# Patient Record
Sex: Female | Born: 1980 | Race: Black or African American | Hispanic: No | Marital: Single | Smoking: Never smoker
Health system: Southern US, Community
[De-identification: ages and names within clinical notes are randomized; demographics above are authoritative.]

## PROBLEM LIST (undated history)

## (undated) DIAGNOSIS — N159 Renal tubulo-interstitial disease, unspecified: Secondary | ICD-10-CM

## (undated) DIAGNOSIS — N390 Urinary tract infection, site not specified: Secondary | ICD-10-CM

## (undated) DIAGNOSIS — J45909 Unspecified asthma, uncomplicated: Secondary | ICD-10-CM

## (undated) DIAGNOSIS — D649 Anemia, unspecified: Secondary | ICD-10-CM

## (undated) HISTORY — DX: Unspecified asthma, uncomplicated: J45.909

---

## 2010-07-29 ENCOUNTER — Emergency Department (HOSPITAL_BASED_OUTPATIENT_CLINIC_OR_DEPARTMENT_OTHER)
Admission: EM | Admit: 2010-07-29 | Discharge: 2010-07-29 | Disposition: A | Discharge status: Home / Self Care | Payer: Self-pay | Attending: Emergency Medicine | Admitting: Emergency Medicine

## 2010-07-29 DIAGNOSIS — E860 Dehydration: Secondary | ICD-10-CM | POA: Insufficient documentation

## 2010-07-29 DIAGNOSIS — K117 Disturbances of salivary secretion: Secondary | ICD-10-CM | POA: Insufficient documentation

## 2010-07-29 LAB — URINALYSIS, ROUTINE W REFLEX MICROSCOPIC
Bilirubin Urine: NEGATIVE
Hgb urine dipstick: NEGATIVE
Ketones, ur: NEGATIVE mg/dL

## 2010-07-29 LAB — PREGNANCY, URINE: Preg Test, Ur: NEGATIVE

## 2010-07-29 LAB — BASIC METABOLIC PANEL
BUN: 15 mg/dL (ref 6–23)
Chloride: 106 mEq/L (ref 96–112)
Potassium: 3.8 mEq/L (ref 3.5–5.1)
Sodium: 144 mEq/L (ref 135–145)

## 2010-07-29 LAB — GLUCOSE, CAPILLARY: Glucose-Capillary: 93 mg/dL (ref 70–99)

## 2011-03-21 ENCOUNTER — Emergency Department (HOSPITAL_BASED_OUTPATIENT_CLINIC_OR_DEPARTMENT_OTHER)
Admission: EM | Admit: 2011-03-21 | Discharge: 2011-03-21 | Disposition: A | Discharge status: Home / Self Care | Payer: Self-pay | Attending: Emergency Medicine | Admitting: Emergency Medicine

## 2011-03-21 DIAGNOSIS — W19XXXA Unspecified fall, initial encounter: Secondary | ICD-10-CM | POA: Insufficient documentation

## 2011-03-21 DIAGNOSIS — S335XXA Sprain of ligaments of lumbar spine, initial encounter: Secondary | ICD-10-CM | POA: Insufficient documentation

## 2011-03-21 DIAGNOSIS — S39012A Strain of muscle, fascia and tendon of lower back, initial encounter: Secondary | ICD-10-CM

## 2011-03-21 DIAGNOSIS — Y9229 Other specified public building as the place of occurrence of the external cause: Secondary | ICD-10-CM | POA: Insufficient documentation

## 2011-03-21 HISTORY — DX: Renal tubulo-interstitial disease, unspecified: N15.9

## 2011-03-21 MED ORDER — IBUPROFEN 600 MG PO TABS: 600.0000 mg | ORAL_TABLET | Freq: Four times a day (QID) | ORAL | Status: AC | PRN

## 2011-03-21 NOTE — ED Provider Notes (Signed)
History     CSN: [id number] Arrival date & time: 03/21/2011  7:32 PM   First MD Initiated Contact with Patient 03/21/11 2031      Chief Complaint  Patient presents with  . Flank Pain  . Fall   [place] She did not initially seek treatment for this but states she continues to have diffuse low back pain. She states the pain is worse with movement or palpation. Apparently had taken some pain medications given to her by a friend. She states the pain is moderate, but not excruciating. She denies any numbness, weakness or tingling. Denies any bowel or bladder incontinence. She denies any chance of being pregnant. She has had no urinary symptoms  (Consider location/radiation/quality/duration/timing/severity/associated sxs/prior treatment) HPI  Past Medical History  Diagnosis Date  . Kidney infection     History reviewed. No pertinent past surgical history.  History reviewed. No pertinent family history.  History  Substance Use Topics  . Smoking status: Never Smoker   . Smokeless tobacco: Never Used  . Alcohol Use: Yes     socially    OB History    Grav Para Term Preterm Abortions TAB SAB Ect Mult Living                  Review of Systems  All other systems reviewed and are negative.    Allergies  Review of patient's allergies indicates no known allergies.  Home Medications   Current Outpatient Rx  Name Route Sig Dispense Refill  . IBUPROFEN 800 MG PO TABS Oral Take 800 mg by mouth every 8 (eight) hours as needed. For pain     . PARAGARD INTRAUTERINE COPPER IU IUD Intrauterine 1 each by Intrauterine route once.        BP 123/73  Pulse 73  Temp(Src) 99 F (37.2 C) (Oral)  Resp 17  Ht 5\' 4"  (1.626 m)  Wt 128 lb (58.06 kg)  BMI 21.97 kg/m2  SpO2 100%  LMP 02/24/2011  Physical Exam  Constitutional: She is oriented to person, place, and time. She appears well-developed and well-nourished.  HENT:  Head: Normocephalic and  atraumatic.  Eyes: Conjunctivae and EOM are normal. Pupils are equal, round, and reactive to light.  Neck: Neck supple.  Cardiovascular: Normal rate and regular rhythm.  Exam reveals no gallop and no friction rub.   No murmur heard. Pulmonary/Chest: Breath sounds normal. She has no wheezes. She has no rales. She exhibits no tenderness.  Abdominal: Soft. Bowel sounds are normal. She exhibits no distension. There is no tenderness. There is no rebound and no guarding.  Musculoskeletal: Normal range of motion.       Diffuse low back tenderness, no soft tissue swelling, no direct spinal tenderness, pelvis is stable. No deformity, no ecchymoses.  Neurological: She is alert and oriented to person, place, and time. She displays normal reflexes. No cranial nerve deficit. Coordination normal.  Skin: Skin is warm and dry. No rash noted.  Psychiatric: She has a normal mood and affect.    ED Course  Procedures (including critical care time)  Labs Reviewed - No data to display No results found.   No diagnosis found.    MDM  Pt is seen and examined;  Initial history and physical completed.  Will follow.          Wadie Mattie A. Patrica Duel, MD 03/21/11 2038

## 2011-03-21 NOTE — ED Notes (Signed)
Pt states that two days ago she fell while shopping in [employer], has not sought prior treatment for this pain.  Pt states that she has pain in both sides of her back/flank area, pt states that she is unable to turn to the left or right every well due to the pain.  Pt states that she has taken some RX pain meds given to her by a friend

## 2012-04-16 ENCOUNTER — Emergency Department (HOSPITAL_BASED_OUTPATIENT_CLINIC_OR_DEPARTMENT_OTHER): Payer: Self-pay

## 2012-04-16 ENCOUNTER — Emergency Department (HOSPITAL_BASED_OUTPATIENT_CLINIC_OR_DEPARTMENT_OTHER)
Admission: EM | Admit: 2012-04-16 | Discharge: 2012-04-16 | Disposition: A | Discharge status: Home / Self Care | Payer: Self-pay | Attending: Emergency Medicine | Admitting: Emergency Medicine

## 2012-04-16 ENCOUNTER — Encounter (HOSPITAL_BASED_OUTPATIENT_CLINIC_OR_DEPARTMENT_OTHER): Payer: Self-pay | Admitting: *Deleted

## 2012-04-16 DIAGNOSIS — S90129A Contusion of unspecified lesser toe(s) without damage to nail, initial encounter: Secondary | ICD-10-CM | POA: Insufficient documentation

## 2012-04-16 DIAGNOSIS — Y939 Activity, unspecified: Secondary | ICD-10-CM | POA: Insufficient documentation

## 2012-04-16 DIAGNOSIS — Z87448 Personal history of other diseases of urinary system: Secondary | ICD-10-CM | POA: Insufficient documentation

## 2012-04-16 DIAGNOSIS — Y929 Unspecified place or not applicable: Secondary | ICD-10-CM | POA: Insufficient documentation

## 2012-04-16 DIAGNOSIS — X58XXXA Exposure to other specified factors, initial encounter: Secondary | ICD-10-CM | POA: Insufficient documentation

## 2012-04-16 DIAGNOSIS — Z975 Presence of (intrauterine) contraceptive device: Secondary | ICD-10-CM | POA: Insufficient documentation

## 2012-04-16 DIAGNOSIS — S90229A Contusion of unspecified lesser toe(s) with damage to nail, initial encounter: Secondary | ICD-10-CM

## 2012-04-16 NOTE — ED Notes (Signed)
Patient states she has a 2 week history of pain in her left great toe.  States she took her toenail polish off and her toenail is green in color.

## 2012-04-16 NOTE — ED Provider Notes (Signed)
History     CSN: [id number]  Arrival date & time 04/16/12  1151   First MD Initiated Contact with Patient 04/16/12 1332      Chief Complaint  Patient presents with  . Toe Pain    (Consider location/radiation/quality/duration/timing/severity/associated sxs/prior treatment) Patient is a 31 y.o. female presenting with toe pain. The history is provided by the patient. No language interpreter was used.  Toe Pain This is a chronic problem. The current episode started 1 to 4 weeks ago. The problem occurs daily. Pertinent negatives include no fever, nausea, vomiting or weakness. The symptoms are aggravated by walking. She has tried nothing for the symptoms.   Patient here with c/o L great toe pain after wearing new shoes 2 weeks ago.  Patient states that the toenail has turned black since.  Pain is 2/10 today.  Taking motrin for pain with relief.  No pmh.  Past Medical History  Diagnosis Date  . Kidney infection     History reviewed. No pertinent past surgical history.  No family history on file.  History  Substance Use Topics  . Smoking status: Never Smoker   . Smokeless tobacco: Never Used  . Alcohol Use: Yes     Comment: socially    OB History    Grav Para Term Preterm Abortions TAB SAB Ect Mult Living                  Review of Systems  Constitutional: Negative.  Negative for fever.  HENT: Negative.   Eyes: Negative.   Respiratory: Negative.   Cardiovascular: Negative.   Gastrointestinal: Negative.  Negative for nausea and vomiting.  Musculoskeletal: Negative for gait problem.       L great toe pain bruising  Neurological: Negative.  Negative for weakness.  Psychiatric/Behavioral: Negative.   All other systems reviewed and are negative.    Allergies  Review of patient's allergies indicates no known allergies.  Home Medications   Current Outpatient Rx  Name  Route  Sig  Dispense  Refill  . IBUPROFEN 800 MG PO TABS   Oral   Take 800 mg by mouth every 8  (eight) hours as needed. For pain          . PARAGARD INTRAUTERINE COPPER IU IUD   Intrauterine   1 each by Intrauterine route once.             BP 115/82  Pulse 84  Temp 98.2 F (36.8 C) (Oral)  Resp 20  Ht 5\' 4"  (1.626 m)  Wt 126 lb (57.153 kg)  BMI 21.63 kg/m2  SpO2 100%  LMP 04/15/2012  Physical Exam  Nursing note and vitals reviewed. Constitutional: She is oriented to person, place, and time. She appears well-developed and well-nourished.  HENT:  Head: Normocephalic and atraumatic.  Eyes: Conjunctivae normal and EOM are normal. Pupils are equal, round, and reactive to light.  Neck: Normal range of motion. Neck supple.  Cardiovascular: Normal rate.   Pulmonary/Chest: Effort normal.  Abdominal: Soft.  Musculoskeletal: Normal range of motion. She exhibits tenderness. She exhibits no edema.       L great toenail blackened and bruised tender to palpatation  Neurological: She is alert and oriented to person, place, and time. She has normal reflexes.  Skin: Skin is warm and dry.  Psychiatric: She has a normal mood and affect.    ED Course  Procedures (including critical care time)  Labs Reviewed - No data to display Dg Toe Great Left  04/16/2012  *RADIOLOGY REPORT*  Clinical Data: Left great toe pain.  LEFT GREAT TOE  Comparison: None.  Findings: No acute bony abnormality.  Specifically, no fracture, subluxation, or dislocation.  Soft tissues are intact. Joint spaces are maintained.  Normal bone mineralization.  IMPRESSION: No bony abnormality.   Original Report Authenticated By: Charlett Nose, M.D.      No diagnosis found.    MDM  L great toe bruising.  X-ray shows no fracture reviewed by myself. Patient understands that she may loose her toenail.  Pain 2/10       Remi Haggard, NP 04/17/12 [phone]

## 2012-04-22 NOTE — ED Provider Notes (Signed)
Medical screening examination/treatment/procedure(s) were performed by non-physician practitioner and as supervising physician I was immediately available for consultation/collaboration.   Richardean Canal, MD 04/22/12 [phone]

## 2012-08-06 ENCOUNTER — Encounter (HOSPITAL_BASED_OUTPATIENT_CLINIC_OR_DEPARTMENT_OTHER): Payer: Self-pay

## 2012-08-06 ENCOUNTER — Emergency Department (HOSPITAL_BASED_OUTPATIENT_CLINIC_OR_DEPARTMENT_OTHER)
Admission: EM | Admit: 2012-08-06 | Discharge: 2012-08-06 | Disposition: A | Discharge status: Home / Self Care | Payer: No Typology Code available for payment source | Attending: Emergency Medicine | Admitting: Emergency Medicine

## 2012-08-06 DIAGNOSIS — Z3202 Encounter for pregnancy test, result negative: Secondary | ICD-10-CM | POA: Insufficient documentation

## 2012-08-06 DIAGNOSIS — N898 Other specified noninflammatory disorders of vagina: Secondary | ICD-10-CM | POA: Insufficient documentation

## 2012-08-06 DIAGNOSIS — R109 Unspecified abdominal pain: Secondary | ICD-10-CM

## 2012-08-06 DIAGNOSIS — R1084 Generalized abdominal pain: Secondary | ICD-10-CM | POA: Insufficient documentation

## 2012-08-06 DIAGNOSIS — Z87440 Personal history of urinary (tract) infections: Secondary | ICD-10-CM | POA: Insufficient documentation

## 2012-08-06 DIAGNOSIS — N939 Abnormal uterine and vaginal bleeding, unspecified: Secondary | ICD-10-CM

## 2012-08-06 DIAGNOSIS — R197 Diarrhea, unspecified: Secondary | ICD-10-CM | POA: Insufficient documentation

## 2012-08-06 HISTORY — DX: Urinary tract infection, site not specified: N39.0

## 2012-08-06 LAB — URINE MICROSCOPIC-ADD ON

## 2012-08-06 LAB — CBC WITH DIFFERENTIAL/PLATELET
Basophils Relative: 0 % (ref 0–1)
Eosinophils Relative: 1 % (ref 0–5)
Hemoglobin: 13.1 g/dL (ref 12.0–15.0)
Lymphocytes Relative: 39 % (ref 12–46)
Monocytes Relative: 9 % (ref 3–12)
Neutrophils Relative %: 51 % (ref 43–77)
Platelets: 241 10*3/uL (ref 150–400)
RBC: 4.38 MIL/uL (ref 3.87–5.11)
WBC: 5 10*3/uL (ref 4.0–10.5)

## 2012-08-06 LAB — COMPREHENSIVE METABOLIC PANEL
ALT: 34 U/L (ref 0–35)
AST: 20 U/L (ref 0–37)
Alkaline Phosphatase: 46 U/L (ref 39–117)
CO2: 26 mEq/L (ref 19–32)
GFR calc Af Amer: >90 mL/min (ref >90)
GFR calc non Af Amer: >90 mL/min (ref >90)
Glucose, Bld: 90 mg/dL (ref 70–99)
Potassium: 3.5 mEq/L (ref 3.5–5.1)
Sodium: 138 mEq/L (ref 135–145)

## 2012-08-06 LAB — URINALYSIS, ROUTINE W REFLEX MICROSCOPIC
Glucose, UA: NEGATIVE mg/dL
Ketones, ur: NEGATIVE mg/dL
Nitrite: NEGATIVE
Specific Gravity, Urine: 1.03 (ref 1.005–1.030)
pH: 6 (ref 5.0–8.0)

## 2012-08-06 LAB — PREGNANCY, URINE: Preg Test, Ur: NEGATIVE

## 2012-08-06 MED ORDER — LOPERAMIDE HCL 2 MG PO CAPS: ORAL_CAPSULE | ORAL | Status: DC

## 2012-08-06 NOTE — ED Notes (Signed)
Pt with mid lower abdominal pain, cramping, reports that she feels like she may have been running a fever.  Vomited once a week ago.  Denies any chronic medical problems.

## 2012-08-06 NOTE — ED Provider Notes (Signed)
History     CSN: [id number]  Arrival date & time 08/06/12  0902   First MD Initiated Contact with Patient 08/06/12 [phone]      Chief Complaint  Patient presents with  . Abdominal Pain    (Consider location/radiation/quality/duration/timing/severity/associated sxs/prior treatment) HPI This 32 year old female has a one-week history of 6-12 episodes per day of nonbloody diarrhea but might possibly have had a little bit of blood in it last night she's not sure, she might have vaginal bleeding instead could not tell the difference and she was wiping, it was a small amount of blood, she is no dysuria or vaginal discharge, she does have intermittent diffuse abdominal pain lasting several hours at a time for the last week, she has multiple friends and relatives with similar symptoms for the last several days as well, she had one episode of nausea and vomiting a week ago when this started, she has not had vomiting or nausea since then, she is able to take oral fluids well, she is no fever rash body aches cough chest pain or shortness of breath, since her symptoms did not resolve and weak she came to the ED for evaluation, her abdominal pain is not severe or localized, there is no treatment prior to arrival. Past Medical History  Diagnosis Date  . Kidney infection   . UTI (lower urinary tract infection)     History reviewed. No pertinent past surgical history.  History reviewed. No pertinent family history.  History  Substance Use Topics  . Smoking status: Never Smoker   . Smokeless tobacco: Never Used  . Alcohol Use: Yes     Comment: socially    OB History   Grav Para Term Preterm Abortions TAB SAB Ect Mult Living                  Review of Systems 10 Systems reviewed and are negative for acute change except as noted in the HPI. Allergies  Review of patient's allergies indicates no known allergies.  Home Medications   Current Outpatient Rx  Name  Route  Sig  Dispense  Refill  .  ibuprofen (ADVIL,MOTRIN) 800 MG tablet   Oral   Take 800 mg by mouth every 8 (eight) hours as needed. For pain          . IUD's (PARAGARD INTRAUTERINE COPPER) IUD IUD   Intrauterine   1 each by Intrauterine route once.           . loperamide (IMODIUM) 2 MG capsule      Take two tabs po initially, then one tab after each loose stool: max 8 tabs in 24 hours   12 capsule   0     BP 103/56  Pulse 60  Temp(Src) 98.9 F (37.2 C) (Oral)  SpO2 100%  LMP 07/15/2012  Physical Exam  Nursing note and vitals reviewed. Constitutional:  Awake, alert, nontoxic appearance.  HENT:  Head: Atraumatic.  Eyes: Right eye exhibits no discharge. Left eye exhibits no discharge.  Neck: Neck supple.  Cardiovascular: Normal rate and regular rhythm.   No murmur heard. Pulmonary/Chest: Effort normal and breath sounds normal. No respiratory distress. She has no wheezes. She has no rales. She exhibits no tenderness.  Abdominal: Soft. Bowel sounds are normal. She exhibits no distension and no mass. There is tenderness. There is no rebound and no guarding.  Mild diffuse nonlocalized tenderness without rebound  Musculoskeletal: She exhibits no edema and no tenderness.  Baseline ROM, no obvious new  focal weakness.  Neurological: She is alert.  Mental status and motor strength appears baseline for patient and situation. Normal speech and gait.  Skin: No rash noted.  Psychiatric: She has a normal mood and affect.   chaperone present for rectal examination reveals light brown stool with no blood; bimanual examination reveals nontender cervix with cervix closed with no cervical motion tenderness no adnexal tenderness no palpable adnexal masses no discharge noted in the examination glove but the patient does have small amount of vaginal bleeding on examination glove  ED Course  Procedures (including critical care time) Suspect contaminated urine specimen. Labs Reviewed  URINALYSIS, ROUTINE W REFLEX  MICROSCOPIC - Abnormal; Notable for the following:    APPearance TURBID (*)    Hgb urine dipstick LARGE (*)    Protein, ur 30 (*)    Leukocytes, UA MODERATE (*)    All other components within normal limits  URINE MICROSCOPIC-ADD ON - Abnormal; Notable for the following:    Squamous Epithelial / LPF MANY (*)    Bacteria, UA MANY (*)    All other components within normal limits  URINE CULTURE  PREGNANCY, URINE  CBC WITH DIFFERENTIAL  COMPREHENSIVE METABOLIC PANEL  LIPASE, BLOOD   No results found.   1. Diarrhea   2. Abdominal pain   3. Abnormal vaginal bleeding       MDM  I doubt any other EMC precluding discharge at this time including, but not necessarily limited to the following:sepsis, PID.        Hurman Horn, MD 08/06/12 [phone]

## 2012-08-07 LAB — URINE CULTURE

## 2012-12-29 ENCOUNTER — Ambulatory Visit (INDEPENDENT_AMBULATORY_CARE_PROVIDER_SITE_OTHER): Payer: No Typology Code available for payment source | Admitting: Family Medicine

## 2012-12-29 ENCOUNTER — Encounter: Payer: Self-pay | Admitting: Family Medicine

## 2012-12-29 ENCOUNTER — Other Ambulatory Visit (HOSPITAL_COMMUNITY)
Admission: RE | Admit: 2012-12-29 | Discharge: 2012-12-29 | Disposition: A | Discharge status: Home / Self Care | Payer: No Typology Code available for payment source | Source: Ambulatory Visit | Attending: Family Medicine | Admitting: Family Medicine

## 2012-12-29 VITALS — BP 114/62 | HR 64 | Temp 98.2°F | Ht 64.5 in | Wt 154.6 lb

## 2012-12-29 DIAGNOSIS — Z01419 Encounter for gynecological examination (general) (routine) without abnormal findings: Secondary | ICD-10-CM | POA: Insufficient documentation

## 2012-12-29 DIAGNOSIS — R609 Edema, unspecified: Secondary | ICD-10-CM

## 2012-12-29 DIAGNOSIS — N390 Urinary tract infection, site not specified: Secondary | ICD-10-CM

## 2012-12-29 DIAGNOSIS — Z124 Encounter for screening for malignant neoplasm of cervix: Secondary | ICD-10-CM

## 2012-12-29 DIAGNOSIS — Z0000 Encounter for general adult medical examination without abnormal findings: Secondary | ICD-10-CM

## 2012-12-29 DIAGNOSIS — Z1151 Encounter for screening for human papillomavirus (HPV): Secondary | ICD-10-CM | POA: Insufficient documentation

## 2012-12-29 LAB — LIPID PANEL
Cholesterol: 120 mg/dL (ref 0–200)
HDL: 45 mg/dL (ref >39.00)
LDL Cholesterol: 65 mg/dL (ref 0–99)
Total CHOL/HDL Ratio: 3
Triglycerides: 48 mg/dL (ref 0.0–149.0)
VLDL: 9.6 mg/dL (ref 0.0–40.0)

## 2012-12-29 LAB — CBC WITH DIFFERENTIAL/PLATELET
Basophils Absolute: 0 10*3/uL (ref 0.0–0.1)
Eosinophils Absolute: 0 10*3/uL (ref 0.0–0.7)
HCT: 38.3 % (ref 36.0–46.0)
Lymphs Abs: 2.3 10*3/uL (ref 0.7–4.0)
MCV: 88.9 fl (ref 78.0–100.0)
Monocytes Absolute: 0.5 10*3/uL (ref 0.1–1.0)
Neutrophils Relative %: 46.3 % (ref 43.0–77.0)
Platelets: 227 10*3/uL (ref 150.0–400.0)
RDW: 13.3 % (ref 11.5–14.6)
WBC: 5.2 10*3/uL (ref 4.5–10.5)

## 2012-12-29 LAB — BASIC METABOLIC PANEL
BUN: 9 mg/dL (ref 6–23)
Chloride: 105 mEq/L (ref 96–112)
Creatinine, Ser: 0.6 mg/dL (ref 0.4–1.2)
Glucose, Bld: 74 mg/dL (ref 70–99)
Potassium: 3.5 mEq/L (ref 3.5–5.1)

## 2012-12-29 LAB — HEPATIC FUNCTION PANEL
ALT: 25 U/L (ref 0–35)
Bilirubin, Direct: 0 mg/dL (ref 0.0–0.3)
Total Bilirubin: 0.5 mg/dL (ref 0.3–1.2)

## 2012-12-29 MED ORDER — HYDROCHLOROTHIAZIDE 12.5 MG PO TABS: 12.5000 mg | ORAL_TABLET | Freq: Every day | ORAL | Status: DC

## 2012-12-29 NOTE — Progress Notes (Signed)
Subjective:     Alicia [name] is a 32 y.o. female and is here for a comprehensive physical exam. The patient reports no problems.  History   Social History  . Marital Status: Single    Spouse Name: N/A    Number of Children: N/A  . Years of Education: N/A   Occupational History  . [employer]     [employer]   Social History Main Topics  . Smoking status: Never Smoker   . Smokeless tobacco: Never Used  . Alcohol Use: Yes     Comment: socially  . Drug Use: No  . Sexual Activity: Yes    Partners: Male    Birth Control/ Protection: IUD   Other Topics Concern  . Not on file   Social History Narrative   Exercise-- no   No health maintenance topics applied.  The following portions of the patient's history were reviewed and updated as appropriate:  She  has a past medical history of Kidney infection; UTI (lower urinary tract infection); and Asthma. She  does not have a problem list on file. She  has no past surgical history on file. Her family history includes Arthritis in her mother; High blood pressure in her maternal grandmother. She  reports that she has never smoked. She has never used smokeless tobacco. She reports that  drinks alcohol. She reports that she does not use illicit drugs. She has a current medication list which includes the following prescription(s): ibuprofen and paragard intrauterine copper. Current Outpatient Prescriptions on File Prior to Visit  Medication Sig Dispense Refill  . ibuprofen (ADVIL,MOTRIN) 800 MG tablet Take 800 mg by mouth every 8 (eight) hours as needed. For pain       . IUD's (PARAGARD INTRAUTERINE COPPER) IUD IUD 1 each by Intrauterine route once.         No current facility-administered medications on file prior to visit.   She has No Known Allergies..  Review of Systems Review of Systems  Constitutional: Negative for activity change, appetite change and fatigue.  HENT: Negative for hearing loss, congestion, tinnitus and ear  discharge.  dentist -no Eyes: Negative for visual disturbance (see optho-no) Respiratory: Negative for cough, chest tightness and shortness of breath.   Cardiovascular: Negative for chest pain, palpitations and leg swelling.  Gastrointestinal: Negative for abdominal pain, diarrhea, constipation and abdominal distention.  Genitourinary: Negative for urgency, frequency, decreased urine volume and difficulty urinating.  Musculoskeletal: Negative for back pain, arthralgias and gait problem.  Skin: Negative for color change, pallor and rash.  Neurological: Negative for dizziness, light-headedness, numbness and headaches.  Hematological: Negative for adenopathy. Does not bruise/bleed easily.  Psychiatric/Behavioral: Negative for suicidal ideas, confusion, sleep disturbance, self-injury, dysphoric mood, decreased concentration and agitation.       Objective:    BP 114/62  Pulse 64  Temp(Src) 98.2 F (36.8 C) (Oral)  Ht 5' 4.5" (1.638 m)  Wt 154 lb 9.6 oz (70.126 kg)  BMI 26.14 kg/m2  SpO2 97%  LMP 12/22/2012 General appearance: alert, cooperative, appears stated age and no distress Head: Normocephalic, without obvious abnormality, atraumatic Eyes: conjunctivae/corneas clear. PERRL, EOM's intact. Fundi benign. Ears: normal TM's and external ear canals both ears Nose: Nares normal. Septum midline. Mucosa normal. No drainage or sinus tenderness. Throat: lips, mucosa, and tongue normal; teeth and gums normal Neck: no adenopathy, no carotid bruit, no JVD, supple, symmetrical, trachea midline and thyroid not enlarged, symmetric, no tenderness/mass/nodules Back: symmetric, no curvature. ROM normal. No CVA tenderness. Lungs: clear  to auscultation bilaterally Breasts: normal appearance, no masses or tenderness Heart: regular rate and rhythm, S1, S2 normal, no murmur, click, rub or gallop Abdomen: soft, non-tender; bowel sounds normal; no masses,  no organomegaly Pelvic: cervix normal in  appearance, external genitalia normal, no adnexal masses or tenderness, no cervical motion tenderness, rectovaginal septum normal, uterus normal size, shape, and consistency, vagina normal without discharge and pap done Extremities: extremities normal, atraumatic, no cyanosis or edema Pulses: 2+ and symmetric Skin: Skin color, texture, turgor normal. No rashes or lesions Lymph nodes: Cervical, supraclavicular, and axillary nodes normal. Neurologic: Alert and oriented X 3, normal strength and tone. Normal symmetric reflexes. Normal coordination and gait Psych- no anxiety, no depression      Assessment:    Healthy female exam.      Plan:    ghm utd Check labs See After Visit Summary for Counseling Recommendations

## 2012-12-29 NOTE — Patient Instructions (Signed)
Preventive Care for Adults, Female A healthy lifestyle and preventive care can promote health and wellness. Preventive health guidelines for women include the following key practices.  A routine yearly physical is a good way to check with your caregiver about your health and preventive screening. It is a chance to share any concerns and updates on your health, and to receive a thorough exam.  Visit your dentist for a routine exam and preventive care every 6 months. Brush your teeth twice a day and floss once a day. Good oral hygiene prevents tooth decay and gum disease.  The frequency of eye exams is based on your age, health, family medical history, use of contact lenses, and other factors. Follow your caregiver's recommendations for frequency of eye exams.  Eat a healthy diet. Foods like vegetables, fruits, whole grains, low-fat dairy products, and lean protein foods contain the nutrients you need without too many calories. Decrease your intake of foods high in solid fats, added sugars, and salt. Eat the right amount of calories for you.Get information about a proper diet from your caregiver, if necessary.  Regular physical exercise is one of the most important things you can do for your health. Most adults should get at least 150 minutes of moderate-intensity exercise (any activity that increases your heart rate and causes you to sweat) each week. In addition, most adults need muscle-strengthening exercises on 2 or more days a week.  Maintain a healthy weight. The body mass index (BMI) is a screening tool to identify possible weight problems. It provides an estimate of body fat based on height and weight. Your caregiver can help determine your BMI, and can help you achieve or maintain a healthy weight.For adults 20 years and older:  A BMI below 18.5 is considered underweight.  A BMI of 18.5 to 24.9 is normal.  A BMI of 25 to 29.9 is considered overweight.  A BMI of 30 and above is  considered obese.  Maintain normal blood lipids and cholesterol levels by exercising and minimizing your intake of saturated fat. Eat a balanced diet with plenty of fruit and vegetables. Blood tests for lipids and cholesterol should begin at age 20 and be repeated every 5 years. If your lipid or cholesterol levels are high, you are over 50, or you are at high risk for heart disease, you may need your cholesterol levels checked more frequently.Ongoing high lipid and cholesterol levels should be treated with medicines if diet and exercise are not effective.  If you smoke, find out from your caregiver how to quit. If you do not use tobacco, do not start.  If you are pregnant, do not drink alcohol. If you are breastfeeding, be very cautious about drinking alcohol. If you are not pregnant and choose to drink alcohol, do not exceed 1 drink per day. One drink is considered to be 12 ounces (355 mL) of beer, 5 ounces (148 mL) of wine, or 1.5 ounces (44 mL) of liquor.  Avoid use of street drugs. Do not share needles with anyone. Ask for help if you need support or instructions about stopping the use of drugs.  High blood pressure causes heart disease and increases the risk of stroke. Your blood pressure should be checked at least every 1 to 2 years. Ongoing high blood pressure should be treated with medicines if weight loss and exercise are not effective.  If you are 55 to 31 years old, ask your caregiver if you should take aspirin to prevent strokes.  Diabetes   screening involves taking a blood sample to check your fasting blood sugar level. This should be done once every 3 years, after age 45, if you are within normal weight and without risk factors for diabetes. Testing should be considered at a younger age or be carried out more frequently if you are overweight and have at least 1 risk factor for diabetes.  Breast cancer screening is essential preventive care for women. You should practice "breast  self-awareness." This means understanding the normal appearance and feel of your breasts and may include breast self-examination. Any changes detected, no matter how small, should be reported to a caregiver. Women in their 20s and 30s should have a clinical breast exam (CBE) by a caregiver as part of a regular health exam every 1 to 3 years. After age 40, women should have a CBE every year. Starting at age 40, women should consider having a mammography (breast X-ray test) every year. Women who have a family history of breast cancer should talk to their caregiver about genetic screening. Women at a high risk of breast cancer should talk to their caregivers about having magnetic resonance imaging (MRI) and a mammography every year.  The Pap test is a screening test for cervical cancer. A Pap test can show cell changes on the cervix that might become cervical cancer if left untreated. A Pap test is a procedure in which cells are obtained and examined from the lower end of the uterus (cervix).  Women should have a Pap test starting at age 21.  Between ages 21 and 29, Pap tests should be repeated every 2 years.  Beginning at age 30, you should have a Pap test every 3 years as long as the past 3 Pap tests have been normal.  Some women have medical problems that increase the chance of getting cervical cancer. Talk to your caregiver about these problems. It is especially important to talk to your caregiver if a new problem develops soon after your last Pap test. In these cases, your caregiver may recommend more frequent screening and Pap tests.  The above recommendations are the same for women who have or have not gotten the vaccine for human papillomavirus (HPV).  If you had a hysterectomy for a problem that was not cancer or a condition that could lead to cancer, then you no longer need Pap tests. Even if you no longer need a Pap test, a regular exam is a good idea to make sure no other problems are  starting.  If you are between ages 65 and 70, and you have had normal Pap tests going back 10 years, you no longer need Pap tests. Even if you no longer need a Pap test, a regular exam is a good idea to make sure no other problems are starting.  If you have had past treatment for cervical cancer or a condition that could lead to cancer, you need Pap tests and screening for cancer for at least 20 years after your treatment.  If Pap tests have been discontinued, risk factors (such as a new sexual partner) need to be reassessed to determine if screening should be resumed.  The HPV test is an additional test that may be used for cervical cancer screening. The HPV test looks for the virus that can cause the cell changes on the cervix. The cells collected during the Pap test can be tested for HPV. The HPV test could be used to screen women aged 30 years and older, and should   be used in women of any age who have unclear Pap test results. After the age of 30, women should have HPV testing at the same frequency as a Pap test.  Colorectal cancer can be detected and often prevented. Most routine colorectal cancer screening begins at the age of 50 and continues through age 75. However, your caregiver may recommend screening at an earlier age if you have risk factors for colon cancer. On a yearly basis, your caregiver may provide home test kits to check for hidden blood in the stool. Use of a small camera at the end of a tube, to directly examine the colon (sigmoidoscopy or colonoscopy), can detect the earliest forms of colorectal cancer. Talk to your caregiver about this at age 50, when routine screening begins. Direct examination of the colon should be repeated every 5 to 10 years through age 75, unless early forms of pre-cancerous polyps or small growths are found.  Hepatitis C blood testing is recommended for all people born from 1945 through 1965 and any individual with known risks for hepatitis C.  Practice  safe sex. Use condoms and avoid high-risk sexual practices to reduce the spread of sexually transmitted infections (STIs). STIs include gonorrhea, chlamydia, syphilis, trichomonas, herpes, HPV, and human immunodeficiency virus (HIV). Herpes, HIV, and HPV are viral illnesses that have no cure. They can result in disability, cancer, and death. Sexually active women aged 25 and younger should be checked for chlamydia. Older women with new or multiple partners should also be tested for chlamydia. Testing for other STIs is recommended if you are sexually active and at increased risk.  Osteoporosis is a disease in which the bones lose minerals and strength with aging. This can result in serious bone fractures. The risk of osteoporosis can be identified using a bone density scan. Women ages 65 and over and women at risk for fractures or osteoporosis should discuss screening with their caregivers. Ask your caregiver whether you should take a calcium supplement or vitamin D to reduce the rate of osteoporosis.  Menopause can be associated with physical symptoms and risks. Hormone replacement therapy is available to decrease symptoms and risks. You should talk to your caregiver about whether hormone replacement therapy is right for you.  Use sunscreen with sun protection factor (SPF) of 30 or more. Apply sunscreen liberally and repeatedly throughout the day. You should seek shade when your shadow is shorter than you. Protect yourself by wearing long sleeves, pants, a wide-brimmed hat, and sunglasses year round, whenever you are outdoors.  Once a month, do a whole body skin exam, using a mirror to look at the skin on your back. Notify your caregiver of new moles, moles that have irregular borders, moles that are larger than a pencil eraser, or moles that have changed in shape or color.  Stay current with required immunizations.  Influenza. You need a dose every fall (or winter). The composition of the flu vaccine  changes each year, so being vaccinated once is not enough.  Pneumococcal polysaccharide. You need 1 to 2 doses if you smoke cigarettes or if you have certain chronic medical conditions. You need 1 dose at age 65 (or older) if you have never been vaccinated.  Tetanus, diphtheria, pertussis (Tdap, Td). Get 1 dose of Tdap vaccine if you are younger than age 65, are over 65 and have contact with an infant, are a healthcare worker, are pregnant, or simply want to be protected from whooping cough. After that, you need a Td   booster dose every 10 years. Consult your caregiver if you have not had at least 3 tetanus and diphtheria-containing shots sometime in your life or have a deep or dirty wound.  HPV. You need this vaccine if you are a woman age 26 or younger. The vaccine is given in 3 doses over 6 months.  Measles, mumps, rubella (MMR). You need at least 1 dose of MMR if you were born in 1957 or later. You may also need a second dose.  Meningococcal. If you are age 19 to 21 and a first-year college student living in a residence hall, or have one of several medical conditions, you need to get vaccinated against meningococcal disease. You may also need additional booster doses.  Zoster (shingles). If you are age 60 or older, you should get this vaccine.  Varicella (chickenpox). If you have never had chickenpox or you were vaccinated but received only 1 dose, talk to your caregiver to find out if you need this vaccine.  Hepatitis A. You need this vaccine if you have a specific risk factor for hepatitis A virus infection or you simply wish to be protected from this disease. The vaccine is usually given as 2 doses, 6 to 18 months apart.  Hepatitis B. You need this vaccine if you have a specific risk factor for hepatitis B virus infection or you simply wish to be protected from this disease. The vaccine is given in 3 doses, usually over 6 months. Preventive Services / Frequency Ages 19 to 39  Blood  pressure check.** / Every 1 to 2 years.  Lipid and cholesterol check.** / Every 5 years beginning at age 20.  Clinical breast exam.** / Every 3 years for women in their 20s and 30s.  Pap test.** / Every 2 years from ages 21 through 29. Every 3 years starting at age 30 through age 65 or 70 with a history of 3 consecutive normal Pap tests.  HPV screening.** / Every 3 years from ages 30 through ages 65 to 70 with a history of 3 consecutive normal Pap tests.  Hepatitis C blood test.** / For any individual with known risks for hepatitis C.  Skin self-exam. / Monthly.  Influenza immunization.** / Every year.  Pneumococcal polysaccharide immunization.** / 1 to 2 doses if you smoke cigarettes or if you have certain chronic medical conditions.  Tetanus, diphtheria, pertussis (Tdap, Td) immunization. / A one-time dose of Tdap vaccine. After that, you need a Td booster dose every 10 years.  HPV immunization. / 3 doses over 6 months, if you are 26 and younger.  Measles, mumps, rubella (MMR) immunization. / You need at least 1 dose of MMR if you were born in 1957 or later. You may also need a second dose.  Meningococcal immunization. / 1 dose if you are age 19 to 21 and a first-year college student living in a residence hall, or have one of several medical conditions, you need to get vaccinated against meningococcal disease. You may also need additional booster doses.  Varicella immunization.** / Consult your caregiver.  Hepatitis A immunization.** / Consult your caregiver. 2 doses, 6 to 18 months apart.  Hepatitis B immunization.** / Consult your caregiver. 3 doses usually over 6 months. Ages 40 to 64  Blood pressure check.** / Every 1 to 2 years.  Lipid and cholesterol check.** / Every 5 years beginning at age 20.  Clinical breast exam.** / Every year after age 40.  Mammogram.** / Every year beginning at age 40   and continuing for as long as you are in good health. Consult with your  caregiver.  Pap test.** / Every 3 years starting at age 30 through age 65 or 70 with a history of 3 consecutive normal Pap tests.  HPV screening.** / Every 3 years from ages 30 through ages 65 to 70 with a history of 3 consecutive normal Pap tests.  Fecal occult blood test (FOBT) of stool. / Every year beginning at age 50 and continuing until age 75. You may not need to do this test if you get a colonoscopy every 10 years.  Flexible sigmoidoscopy or colonoscopy.** / Every 5 years for a flexible sigmoidoscopy or every 10 years for a colonoscopy beginning at age 50 and continuing until age 75.  Hepatitis C blood test.** / For all people born from 1945 through 1965 and any individual with known risks for hepatitis C.  Skin self-exam. / Monthly.  Influenza immunization.** / Every year.  Pneumococcal polysaccharide immunization.** / 1 to 2 doses if you smoke cigarettes or if you have certain chronic medical conditions.  Tetanus, diphtheria, pertussis (Tdap, Td) immunization.** / A one-time dose of Tdap vaccine. After that, you need a Td booster dose every 10 years.  Measles, mumps, rubella (MMR) immunization. / You need at least 1 dose of MMR if you were born in 1957 or later. You may also need a second dose.  Varicella immunization.** / Consult your caregiver.  Meningococcal immunization.** / Consult your caregiver.  Hepatitis A immunization.** / Consult your caregiver. 2 doses, 6 to 18 months apart.  Hepatitis B immunization.** / Consult your caregiver. 3 doses, usually over 6 months. Ages 65 and over  Blood pressure check.** / Every 1 to 2 years.  Lipid and cholesterol check.** / Every 5 years beginning at age 20.  Clinical breast exam.** / Every year after age 40.  Mammogram.** / Every year beginning at age 40 and continuing for as long as you are in good health. Consult with your caregiver.  Pap test.** / Every 3 years starting at age 30 through age 65 or 70 with a 3  consecutive normal Pap tests. Testing can be stopped between 65 and 70 with 3 consecutive normal Pap tests and no abnormal Pap or HPV tests in the past 10 years.  HPV screening.** / Every 3 years from ages 30 through ages 65 or 70 with a history of 3 consecutive normal Pap tests. Testing can be stopped between 65 and 70 with 3 consecutive normal Pap tests and no abnormal Pap or HPV tests in the past 10 years.  Fecal occult blood test (FOBT) of stool. / Every year beginning at age 50 and continuing until age 75. You may not need to do this test if you get a colonoscopy every 10 years.  Flexible sigmoidoscopy or colonoscopy.** / Every 5 years for a flexible sigmoidoscopy or every 10 years for a colonoscopy beginning at age 50 and continuing until age 75.  Hepatitis C blood test.** / For all people born from 1945 through 1965 and any individual with known risks for hepatitis C.  Osteoporosis screening.** / A one-time screening for women ages 65 and over and women at risk for fractures or osteoporosis.  Skin self-exam. / Monthly.  Influenza immunization.** / Every year.  Pneumococcal polysaccharide immunization.** / 1 dose at age 65 (or older) if you have never been vaccinated.  Tetanus, diphtheria, pertussis (Tdap, Td) immunization. / A one-time dose of Tdap vaccine if you are over   65 and have contact with an infant, are a healthcare worker, or simply want to be protected from whooping cough. After that, you need a Td booster dose every 10 years.  Varicella immunization.** / Consult your caregiver.  Meningococcal immunization.** / Consult your caregiver.  Hepatitis A immunization.** / Consult your caregiver. 2 doses, 6 to 18 months apart.  Hepatitis B immunization.** / Check with your caregiver. 3 doses, usually over 6 months. ** Family history and personal history of risk and conditions may change your caregiver's recommendations. Document Released: 06/03/2001 Document Revised: 06/30/2011  Document Reviewed: 09/02/2010 [employer] Patient Information 2014 [employer].  

## 2012-12-30 LAB — POCT URINALYSIS DIPSTICK
Glucose, UA: NEGATIVE
Spec Grav, UA: 1.02
Urobilinogen, UA: 0.2
pH, UA: 6.5

## 2012-12-30 NOTE — Addendum Note (Signed)
Addended by: Silvio Pate D on: 12/30/2012 09:17 AM   Modules accepted: Orders

## 2013-01-02 LAB — URINE CULTURE

## 2013-01-03 ENCOUNTER — Other Ambulatory Visit: Payer: Self-pay

## 2013-01-03 MED ORDER — CIPROFLOXACIN HCL 500 MG PO TABS: 500.0000 mg | ORAL_TABLET | Freq: Two times a day (BID) | ORAL | Status: DC

## 2013-02-24 ENCOUNTER — Other Ambulatory Visit: Payer: Self-pay

## 2015-06-28 ENCOUNTER — Emergency Department (HOSPITAL_BASED_OUTPATIENT_CLINIC_OR_DEPARTMENT_OTHER): Payer: Self-pay

## 2015-06-28 ENCOUNTER — Encounter (HOSPITAL_BASED_OUTPATIENT_CLINIC_OR_DEPARTMENT_OTHER): Payer: Self-pay | Admitting: *Deleted

## 2015-06-28 ENCOUNTER — Emergency Department (HOSPITAL_BASED_OUTPATIENT_CLINIC_OR_DEPARTMENT_OTHER)
Admission: EM | Admit: 2015-06-28 | Discharge: 2015-06-28 | Disposition: A | Discharge status: Home / Self Care | Payer: Self-pay | Attending: Emergency Medicine | Admitting: Emergency Medicine

## 2015-06-28 DIAGNOSIS — R059 Cough, unspecified: Secondary | ICD-10-CM

## 2015-06-28 DIAGNOSIS — Z79899 Other long term (current) drug therapy: Secondary | ICD-10-CM | POA: Insufficient documentation

## 2015-06-28 DIAGNOSIS — J069 Acute upper respiratory infection, unspecified: Secondary | ICD-10-CM | POA: Insufficient documentation

## 2015-06-28 DIAGNOSIS — Z87448 Personal history of other diseases of urinary system: Secondary | ICD-10-CM | POA: Insufficient documentation

## 2015-06-28 DIAGNOSIS — R079 Chest pain, unspecified: Secondary | ICD-10-CM | POA: Insufficient documentation

## 2015-06-28 DIAGNOSIS — R42 Dizziness and giddiness: Secondary | ICD-10-CM | POA: Insufficient documentation

## 2015-06-28 DIAGNOSIS — Z792 Long term (current) use of antibiotics: Secondary | ICD-10-CM | POA: Insufficient documentation

## 2015-06-28 DIAGNOSIS — R05 Cough: Secondary | ICD-10-CM

## 2015-06-28 DIAGNOSIS — R197 Diarrhea, unspecified: Secondary | ICD-10-CM | POA: Insufficient documentation

## 2015-06-28 DIAGNOSIS — Z87440 Personal history of urinary (tract) infections: Secondary | ICD-10-CM | POA: Insufficient documentation

## 2015-06-28 DIAGNOSIS — J45909 Unspecified asthma, uncomplicated: Secondary | ICD-10-CM | POA: Insufficient documentation

## 2015-06-28 MED ORDER — KETOROLAC TROMETHAMINE 30 MG/ML IJ SOLN
30.0000 mg | Freq: Once | INTRAMUSCULAR | Status: AC
  Administered 2015-06-28: 30 mg via INTRAVENOUS
  Filled 2015-06-28: qty 1

## 2015-06-28 MED ORDER — SODIUM CHLORIDE 0.9 % IV BOLUS (SEPSIS)
1000.0000 mL | Freq: Once | INTRAVENOUS | Status: AC
  Administered 2015-06-28: 1000 mL via INTRAVENOUS

## 2015-06-28 MED ORDER — BENZONATATE 100 MG PO CAPS: 100.0000 mg | ORAL_CAPSULE | Freq: Three times a day (TID) | ORAL | Status: DC

## 2015-06-28 MED FILL — BENZONATATE 100 MG CAPSULE: 100 | 7 days supply | Qty: 21 | Fill #0

## 2015-06-28 NOTE — ED Provider Notes (Signed)
CSN: [id number]     Arrival date & time 06/28/15  1003 History   First MD Initiated Contact with Patient 06/28/15 1012     Chief Complaint  Patient presents with  . Cough     (Consider location/radiation/quality/duration/timing/severity/associated sxs/prior Treatment) HPI Comments: Patient presents today with a complaints of subjective fever, cough, congestion, body aches, lightheadedness, loose stool, and chest pain with coughing.  She reports that the cough has been present for the past week.  Then three days ago she developed the body aches, subjective fever, intermittent headache, and sore throat.  Two days ago she developed the pain across her chest with coughing.  Yesterday she had two episodes of loose stool.   She denies any chest pain aside from the pain with coughing.  She denies nausea, vomiting, abdominal pain, hematochezia, melena, urinary symptoms, SOB, hemoptysis, sinus pain, ear pain, vision changes, syncope, focal weakness, or any other symptoms.  She has been taking Ibuprofen for her symptoms without improvement.  No known sick contacts.  She did not have a flu vaccine this year.  The history is provided by the patient.    Past Medical History  Diagnosis Date  . Kidney infection   . UTI (lower urinary tract infection)   . Asthma    History reviewed. No pertinent past surgical history. Family History  Problem Relation Age of Onset  . Arthritis Mother   . High blood pressure Maternal Grandmother    Social History  Substance Use Topics  . Smoking status: Never Smoker   . Smokeless tobacco: Never Used  . Alcohol Use: Yes     Comment: socially   OB History    No data available     Review of Systems  All other systems reviewed and are negative.     Allergies  Review of patient's allergies indicates no known allergies.  Home Medications   Prior to Admission medications   Medication Sig Start Date End Date Taking? Authorizing Provider  IUD's [facility]  INTRAUTERINE COPPER) IUD IUD 1 each by Intrauterine route once.     Yes Historical Provider, MD  ciprofloxacin (CIPRO) 500 MG tablet Take 1 tablet (500 mg total) by mouth 2 (two) times daily. 01/03/13   Lelon Perla, DO  hydrochlorothiazide (HYDRODIURIL) 12.5 MG tablet Take 1 tablet (12.5 mg total) by mouth daily. 12/29/12   Lelon Perla, DO  ibuprofen (ADVIL,MOTRIN) 800 MG tablet Take 800 mg by mouth every 8 (eight) hours as needed. For pain     Historical Provider, MD   BP 112/71 mmHg  Pulse 84  Temp(Src) 98.4 F (36.9 C) (Oral)  Resp 18  Ht 5' 4.5" (1.638 m)  Wt 72.576 kg  BMI 27.05 kg/m2  SpO2 99%  LMP 06/05/2015 (Approximate) Physical Exam  Constitutional: She appears well-developed and well-nourished.  HENT:  Head: Normocephalic and atraumatic.  Right Ear: Tympanic membrane and ear canal normal.  Left Ear: Tympanic membrane and ear canal normal.  Mouth/Throat: Oropharynx is clear and moist.  Neck: Normal range of motion. Neck supple.  Cardiovascular: Normal rate, regular rhythm and normal heart sounds.   Pulmonary/Chest: Effort normal and breath sounds normal.  Abdominal: Soft. There is no tenderness.  Musculoskeletal: Normal range of motion.  Neurological: She is alert.  Skin: Skin is warm and dry.  Psychiatric: She has a normal mood and affect.  Nursing note and vitals reviewed.   ED Course  Procedures (including critical care time) Labs Review Labs Reviewed - No data to  display  Imaging Review Dg Chest 2 View  06/28/2015  CLINICAL DATA:  35 year old with cough and body aches for 1 week. Nonsmoker. EXAM: CHEST  2 VIEW COMPARISON:  None. FINDINGS: The heart size and mediastinal contours are normal. The lungs are clear. There is no pleural effusion or pneumothorax. No acute osseous findings are identified. IMPRESSION: No active cardiopulmonary process. Electronically Signed   By: Carey BullocksWilliam  Veazey M.D.   On: 06/28/2015 11:01   I have personally reviewed and evaluated  these images and lab results as part of my medical decision-making.   EKG Interpretation None      MDM   Final diagnoses:  Cough   Patient presents today with subjective fever, cough, body aches, and congestion.  CXR is negative.  Symptoms most consistent with Viral URI.  Lightheadedness improved with IVF.  Patient stable for discharge.  Return precautions given.    Santiago GladHeather Rochester Serpe, PA-C 06/28/15 2226  Vanetta MuldersScott Zackowski, MD 06/29/15 [phone]

## 2015-06-28 NOTE — ED Notes (Signed)
PA at bedside.

## 2015-06-28 NOTE — ED Notes (Signed)
Pt reports cough (chest hurts with cough), runny nose, "feeling lightheaded", achy. No documented fever. Sx started over the weekend. Loose BM x 2 today

## 2019-08-02 ENCOUNTER — Emergency Department (HOSPITAL_BASED_OUTPATIENT_CLINIC_OR_DEPARTMENT_OTHER)
Admission: EM | Admit: 2019-08-02 | Discharge: 2019-08-03 | Disposition: A | Discharge status: Home / Self Care | Payer: No Typology Code available for payment source | Attending: Emergency Medicine | Admitting: Emergency Medicine

## 2019-08-02 ENCOUNTER — Other Ambulatory Visit: Payer: Self-pay

## 2019-08-02 ENCOUNTER — Emergency Department (HOSPITAL_BASED_OUTPATIENT_CLINIC_OR_DEPARTMENT_OTHER): Payer: No Typology Code available for payment source

## 2019-08-02 ENCOUNTER — Encounter (HOSPITAL_BASED_OUTPATIENT_CLINIC_OR_DEPARTMENT_OTHER): Payer: Self-pay | Admitting: *Deleted

## 2019-08-02 DIAGNOSIS — Z79899 Other long term (current) drug therapy: Secondary | ICD-10-CM | POA: Diagnosis not present

## 2019-08-02 DIAGNOSIS — R0602 Shortness of breath: Secondary | ICD-10-CM | POA: Diagnosis not present

## 2019-08-02 DIAGNOSIS — R072 Precordial pain: Secondary | ICD-10-CM | POA: Diagnosis not present

## 2019-08-02 DIAGNOSIS — R202 Paresthesia of skin: Secondary | ICD-10-CM | POA: Diagnosis not present

## 2019-08-02 DIAGNOSIS — R079 Chest pain, unspecified: Secondary | ICD-10-CM | POA: Diagnosis present

## 2019-08-02 LAB — COMPREHENSIVE METABOLIC PANEL
ALT: 12 U/L (ref 0–44)
AST: 16 U/L (ref 15–41)
Albumin: 3.8 g/dL (ref 3.5–5.0)
Alkaline Phosphatase: 49 U/L (ref 38–126)
Anion gap: 12 (ref 5–15)
BUN: 7 mg/dL (ref 6–20)
CO2: 21 mmol/L — ABNORMAL LOW (ref 22–32)
Calcium: 9.1 mg/dL (ref 8.9–10.3)
Chloride: 104 mmol/L (ref 98–111)
Creatinine, Ser: 0.68 mg/dL (ref 0.44–1.00)
GFR calc Af Amer: >60 mL/min (ref >60)
GFR calc non Af Amer: >60 mL/min (ref >60)
Glucose, Bld: 84 mg/dL (ref 70–99)
Potassium: 3.7 mmol/L (ref 3.5–5.1)
Sodium: 137 mmol/L (ref 135–145)
Total Bilirubin: 0.6 mg/dL (ref 0.3–1.2)
Total Protein: 7.8 g/dL (ref 6.5–8.1)

## 2019-08-02 LAB — CBC WITH DIFFERENTIAL/PLATELET
Abs Immature Granulocytes: 0.01 10*3/uL (ref 0.00–0.07)
Basophils Absolute: 0 10*3/uL (ref 0.0–0.1)
Basophils Relative: 1 %
Eosinophils Absolute: 0.1 10*3/uL (ref 0.0–0.5)
Eosinophils Relative: 1 %
HCT: 39.7 % (ref 36.0–46.0)
Hemoglobin: 12.4 g/dL (ref 12.0–15.0)
Immature Granulocytes: 0 %
Lymphocytes Relative: 51 %
Lymphs Abs: 2.9 10*3/uL (ref 0.7–4.0)
MCH: 29.4 pg (ref 26.0–34.0)
MCHC: 31.2 g/dL (ref 30.0–36.0)
MCV: 94.1 fL (ref 80.0–100.0)
Monocytes Absolute: 0.5 10*3/uL (ref 0.1–1.0)
Monocytes Relative: 9 %
Neutro Abs: 2.1 10*3/uL (ref 1.7–7.7)
Neutrophils Relative %: 38 %
Platelets: 287 10*3/uL (ref 150–400)
RBC: 4.22 MIL/uL (ref 3.87–5.11)
RDW: 12.7 % (ref 11.5–15.5)
WBC: 5.6 10*3/uL (ref 4.0–10.5)
nRBC: 0 % (ref 0.0–0.2)

## 2019-08-02 LAB — TROPONIN I (HIGH SENSITIVITY): Troponin I (High Sensitivity): <2 ng/L (ref <18)

## 2019-08-02 NOTE — ED Notes (Signed)
Patient transported to X-ray 

## 2019-08-02 NOTE — ED Triage Notes (Signed)
Pt c/o chest pain with SOB ad left arm tinging x x 1 hr ago , pt states  increased stress

## 2019-08-03 ENCOUNTER — Encounter (HOSPITAL_BASED_OUTPATIENT_CLINIC_OR_DEPARTMENT_OTHER): Payer: Self-pay | Admitting: Emergency Medicine

## 2019-08-03 LAB — TROPONIN I (HIGH SENSITIVITY): Troponin I (High Sensitivity): 3 ng/L (ref <18)

## 2019-08-03 MED ORDER — ALUM & MAG HYDROXIDE-SIMETH 200-200-20 MG/5ML PO SUSP
30.0000 mL | Freq: Once | ORAL | Status: AC
  Administered 2019-08-03: 30 mL via ORAL
  Filled 2019-08-03: qty 30

## 2019-08-03 MED ORDER — OMEPRAZOLE 20 MG PO CPDR: 20.0000 mg | DELAYED_RELEASE_CAPSULE | Freq: Every day | ORAL | 0 refills | Status: DC

## 2019-08-03 NOTE — ED Provider Notes (Signed)
[facility] EMERGENCY DEPARTMENT Provider Note   CSN: [id number] Arrival date & time: 08/02/19  1859     History Chief Complaint  Patient presents with  . Chest Pain    Alicia [name] is a 39 y.o. female.  The history is provided by the patient.  Chest Pain Pain location:  Substernal area Pain quality: dull   Pain radiates to:  Does not radiate Pain severity:  Mild Onset quality:  Gradual Duration:  1 day Timing:  Constant Progression:  Partially resolved Chronicity:  New Context: at rest   Relieved by:  Nothing Worsened by:  Nothing Ineffective treatments:  None tried Associated symptoms: no abdominal pain, no AICD problem, no altered mental status, no anorexia, no anxiety, no back pain, no claudication, no cough, no diaphoresis, no dizziness, no dysphagia, no fatigue, no fever, no headache, no heartburn, no lower extremity edema, no nausea, no near-syncope, no numbness, no orthopnea, no palpitations, no PND, no syncope, no vomiting and no weakness   Risk factors: no aortic disease and no prior DVT/PE   Lasted all day worse lying flat.  No exertional symptoms. No leg pain nor swelling.  No OCP.  No f/c/r.     Past Medical History:  Diagnosis Date  . Asthma   . Kidney infection   . UTI (lower urinary tract infection)     There are no problems to display for this patient.   History reviewed. No pertinent surgical history.   OB History   No obstetric history on file.     Family History  Problem Relation Age of Onset  . Arthritis Mother   . High blood pressure Maternal Grandmother     Social History   Tobacco Use  . Smoking status: Never Smoker  . Smokeless tobacco: Never Used  Substance Use Topics  . Alcohol use: Yes    Comment: socially  . Drug use: No    Home Medications Prior to Admission medications   Medication Sig Start Date End Date Taking? Authorizing Provider  benzonatate (TESSALON) 100 MG capsule Take 1 capsule (100 mg  total) by mouth every 8 (eight) hours. 06/28/15   Santiago Glad, PA-C  ciprofloxacin (CIPRO) 500 MG tablet Take 1 tablet (500 mg total) by mouth 2 (two) times [name]. 01/03/13   Donato Schultz, DO  hydrochlorothiazide (HYDRODIURIL) 12.5 MG tablet Take 1 tablet (12.5 mg total) by mouth [name]. 12/29/12   Donato Schultz, DO  ibuprofen (ADVIL,MOTRIN) 800 MG tablet Take 800 mg by mouth every 8 (eight) hours as needed. For pain     [provider]  IUD's (PARAGARD INTRAUTERINE COPPER) IUD IUD 1 each by Intrauterine route once.      [provider]  omeprazole (PRILOSEC) 20 MG capsule Take 1 capsule (20 mg total) by mouth [name]. 08/03/19   Baylie Drakes, MD    Allergies    Patient has no known allergies.  Review of Systems   Review of Systems  Constitutional: Negative for diaphoresis, fatigue and fever.  HENT: Negative for trouble swallowing.   Respiratory: Negative for cough.   Cardiovascular: Positive for chest pain. Negative for palpitations, orthopnea, claudication, syncope, PND and near-syncope.  Gastrointestinal: Negative for abdominal pain, anorexia, heartburn, nausea and vomiting.  Genitourinary: Negative for difficulty urinating.  Musculoskeletal: Negative for back pain.  Neurological: Negative for dizziness, weakness, numbness and headaches.  Psychiatric/Behavioral: Negative for agitation.  All other systems reviewed and are negative.   Physical Exam Updated Vital Signs BP  115/75 (BP Location: Right Arm)   Pulse (!) 54   Temp 97.8 F (36.6 C) (Oral)   Resp 18   Ht 5\' 3"  (1.6 m)   Wt 68 kg   LMP 07/21/2019   SpO2 100%   BMI 26.57 kg/m   Physical Exam Vitals and nursing note reviewed.  Constitutional:      General: She is not in acute distress.    Appearance: Normal appearance.  HENT:     Head: Normocephalic and atraumatic.     Nose: Nose normal.  Eyes:     Conjunctiva/sclera: Conjunctivae normal.     Pupils: Pupils are equal, round, and  reactive to light.  Cardiovascular:     Rate and Rhythm: Normal rate and regular rhythm.     Pulses: Normal pulses.     Heart sounds: Normal heart sounds.  Pulmonary:     Effort: Pulmonary effort is normal.     Breath sounds: Normal breath sounds.  Abdominal:     General: Abdomen is flat. Bowel sounds are normal.     Tenderness: There is no abdominal tenderness. There is no guarding.  Musculoskeletal:        General: Normal range of motion.     Cervical back: Normal range of motion and neck supple.  Skin:    General: Skin is warm and dry.     Capillary Refill: Capillary refill takes less than 2 seconds.  Neurological:     General: No focal deficit present.     Mental Status: She is alert and oriented to person, place, and time.     Deep Tendon Reflexes: Reflexes normal.  Psychiatric:        Mood and Affect: Mood normal.        Behavior: Behavior normal.     ED Results / Procedures / Treatments   Labs (all labs ordered are listed, but only abnormal results are displayed) Results for orders placed or performed during the hospital encounter of 08/02/19  CBC with Differential  Result Value Ref Range   WBC 5.6 4.0 - 10.5 K/uL   RBC 4.22 3.87 - 5.11 MIL/uL   Hemoglobin 12.4 12.0 - 15.0 g/dL   HCT 08/04/19 31.4 - 97.0 %   MCV 94.1 80.0 - 100.0 fL   MCH 29.4 26.0 - 34.0 pg   MCHC 31.2 30.0 - 36.0 g/dL   RDW 26.3 78.5 - 88.5 %   Platelets 287 150 - 400 K/uL   nRBC 0.0 0.0 - 0.2 %   Neutrophils Relative % 38 %   Neutro Abs 2.1 1.7 - 7.7 K/uL   Lymphocytes Relative 51 %   Lymphs Abs 2.9 0.7 - 4.0 K/uL   Monocytes Relative 9 %   Monocytes Absolute 0.5 0.1 - 1.0 K/uL   Eosinophils Relative 1 %   Eosinophils Absolute 0.1 0.0 - 0.5 K/uL   Basophils Relative 1 %   Basophils Absolute 0.0 0.0 - 0.1 K/uL   Immature Granulocytes 0 %   Abs Immature Granulocytes 0.01 0.00 - 0.07 K/uL  Comprehensive metabolic panel  Result Value Ref Range   Sodium 137 135 - 145 mmol/L   Potassium 3.7  3.5 - 5.1 mmol/L   Chloride 104 98 - 111 mmol/L   CO2 21 (L) 22 - 32 mmol/L   Glucose, Bld 84 70 - 99 mg/dL   BUN 7 6 - 20 mg/dL   Creatinine, Ser 02.7 0.44 - 1.00 mg/dL   Calcium 9.1 8.9 - 7.41 mg/dL  Total Protein 7.8 6.5 - 8.1 g/dL   Albumin 3.8 3.5 - 5.0 g/dL   AST 16 15 - 41 U/L   ALT 12 0 - 44 U/L   Alkaline Phosphatase 49 38 - 126 U/L   Total Bilirubin 0.6 0.3 - 1.2 mg/dL   GFR calc non Af Amer >60 >60 mL/min   GFR calc Af Amer >60 >60 mL/min   Anion gap 12 5 - 15  Troponin I (High Sensitivity)  Result Value Ref Range   Troponin I (High Sensitivity) <2 <18 ng/L  Troponin I (High Sensitivity)  Result Value Ref Range   Troponin I (High Sensitivity) 3 <18 ng/L   DG Chest 2 View  Result Date: 08/02/2019 CLINICAL DATA:  Chest pain and shortness of breath EXAM: CHEST - 2 VIEW COMPARISON:  06/28/2015 FINDINGS: The heart size and mediastinal contours are within normal limits. Both lungs are clear. The visualized skeletal structures are unremarkable. IMPRESSION: No active cardiopulmonary disease. Electronically Signed   By: Inez Catalina M.D.   On: 08/02/2019 19:45    EKG EKG Interpretation  Date/Time:  Tuesday Jermani Eberlein 13 2021 19:05:16 EDT Ventricular Rate:  66 PR Interval:    QRS Duration: 72 QT Interval:  380 QTC Calculation: 398 R Axis:   78 Text Interpretation: Undetermined rhythm Otherwise normal ECG Confirmed by Madalyn Rob [phone]) on 08/02/2019 10:10:42 PM   Radiology DG Chest 2 View  Result Date: 08/02/2019 CLINICAL DATA:  Chest pain and shortness of breath EXAM: CHEST - 2 VIEW COMPARISON:  06/28/2015 FINDINGS: The heart size and mediastinal contours are within normal limits. Both lungs are clear. The visualized skeletal structures are unremarkable. IMPRESSION: No active cardiopulmonary disease. Electronically Signed   By: Inez Catalina M.D.   On: 08/02/2019 19:45    Procedures Procedures (including critical care time)  Medications Ordered in ED Medications    alum & mag hydroxide-simeth (MAALOX/MYLANTA) 200-200-20 MG/5ML suspension 30 mL (30 mLs Oral Given 08/03/19 0025)    ED Course  I have reviewed the triage vital signs and the nursing notes.  Pertinent labs & imaging results that were available during my care of the patient were reviewed by me and considered in my medical decision making (see chart for details).   Heart score is 1 very low risk for MACE, ruled out for MI in the ED. PERC negative wells 0 highly doubt PE in this low risk patient.  Symptoms likely GERD.  Will start PPI and gerd friendly diet.   Alicia [name] was evaluated in Emergency Department on 08/03/2019 for the symptoms described in the history of present illness. She was evaluated in the context of the global COVID-19 pandemic, which necessitated consideration that the patient might be at risk for infection with the SARS-CoV-2 virus that causes COVID-19. Institutional protocols and algorithms that pertain to the evaluation of patients at risk for COVID-19 are in a state of rapid change based on information released by regulatory bodies including the CDC and federal and state organizations. These policies and algorithms were followed during the patient's care in the ED.   Final Clinical Impression(s) / ED Diagnoses Final diagnoses:  Precordial pain  Return for weakness, numbness, changes in vision or speech, fevers >100.4 unrelieved by medication, shortness of breath, intractable vomiting, or diarrhea, abdominal pain, Inability to tolerate liquids or food, cough, altered mental status or any concerns. No signs of systemic illness or infection. The patient is nontoxic-appearing on exam and vital signs are within normal limits.  I have reviewed the triage vital signs and the nursing notes. Pertinent labs &imaging results that were available during my care of the patient were reviewed by me and considered in my medical decision making (see chart for details).  After  history, exam, and medical workup I feel the patient has been appropriately medically screened and is safe for discharge home. Pertinent diagnoses were discussed with the patient. Patient was givenstrictreturn precautions.  Rx / DC Orders ED Discharge Orders         Ordered    omeprazole (PRILOSEC) 20 MG capsule  [name]     08/03/19 0033           Krishang Reading, MD 08/03/19 [id number]

## 2020-02-20 ENCOUNTER — Emergency Department (HOSPITAL_BASED_OUTPATIENT_CLINIC_OR_DEPARTMENT_OTHER)
Admission: EM | Admit: 2020-02-20 | Discharge: 2020-02-20 | Disposition: A | Discharge status: Home / Self Care | Payer: 59 | Attending: Emergency Medicine | Admitting: Emergency Medicine

## 2020-02-20 ENCOUNTER — Emergency Department (HOSPITAL_BASED_OUTPATIENT_CLINIC_OR_DEPARTMENT_OTHER): Payer: 59

## 2020-02-20 ENCOUNTER — Other Ambulatory Visit (HOSPITAL_BASED_OUTPATIENT_CLINIC_OR_DEPARTMENT_OTHER): Payer: Self-pay | Admitting: Emergency Medicine

## 2020-02-20 ENCOUNTER — Encounter (HOSPITAL_BASED_OUTPATIENT_CLINIC_OR_DEPARTMENT_OTHER): Payer: Self-pay | Admitting: Emergency Medicine

## 2020-02-20 ENCOUNTER — Other Ambulatory Visit: Payer: Self-pay

## 2020-02-20 DIAGNOSIS — R1030 Lower abdominal pain, unspecified: Secondary | ICD-10-CM | POA: Diagnosis present

## 2020-02-20 DIAGNOSIS — R1084 Generalized abdominal pain: Secondary | ICD-10-CM | POA: Diagnosis not present

## 2020-02-20 DIAGNOSIS — R112 Nausea with vomiting, unspecified: Secondary | ICD-10-CM | POA: Insufficient documentation

## 2020-02-20 DIAGNOSIS — J45909 Unspecified asthma, uncomplicated: Secondary | ICD-10-CM | POA: Diagnosis not present

## 2020-02-20 DIAGNOSIS — R197 Diarrhea, unspecified: Secondary | ICD-10-CM | POA: Insufficient documentation

## 2020-02-20 LAB — URINALYSIS, MICROSCOPIC (REFLEX)

## 2020-02-20 LAB — LIPASE, BLOOD: Lipase: 24 U/L (ref 11–51)

## 2020-02-20 LAB — CBC
HCT: 42 % (ref 36.0–46.0)
Hemoglobin: 13.5 g/dL (ref 12.0–15.0)
MCH: 29.2 pg (ref 26.0–34.0)
MCHC: 32.1 g/dL (ref 30.0–36.0)
MCV: 90.7 fL (ref 80.0–100.0)
Platelets: 322 10*3/uL (ref 150–400)
RBC: 4.63 MIL/uL (ref 3.87–5.11)
RDW: 13 % (ref 11.5–15.5)
WBC: 8.5 10*3/uL (ref 4.0–10.5)
nRBC: 0 % (ref 0.0–0.2)

## 2020-02-20 LAB — PREGNANCY, URINE: Preg Test, Ur: NEGATIVE

## 2020-02-20 LAB — COMPREHENSIVE METABOLIC PANEL
ALT: 43 U/L (ref 0–44)
AST: 18 U/L (ref 15–41)
Albumin: 3.5 g/dL (ref 3.5–5.0)
Alkaline Phosphatase: 44 U/L (ref 38–126)
Anion gap: 10 (ref 5–15)
BUN: 7 mg/dL (ref 6–20)
CO2: 21 mmol/L — ABNORMAL LOW (ref 22–32)
Calcium: 8.9 mg/dL (ref 8.9–10.3)
Chloride: 104 mmol/L (ref 98–111)
Creatinine, Ser: 0.75 mg/dL (ref 0.44–1.00)
GFR, Estimated: >60 mL/min (ref >60)
Glucose, Bld: 92 mg/dL (ref 70–99)
Potassium: 4 mmol/L (ref 3.5–5.1)
Sodium: 135 mmol/L (ref 135–145)
Total Bilirubin: 0.6 mg/dL (ref 0.3–1.2)
Total Protein: 7.4 g/dL (ref 6.5–8.1)

## 2020-02-20 LAB — URINALYSIS, ROUTINE W REFLEX MICROSCOPIC
Glucose, UA: NEGATIVE mg/dL
Ketones, ur: NEGATIVE mg/dL
Leukocytes,Ua: NEGATIVE
Nitrite: NEGATIVE
Protein, ur: NEGATIVE mg/dL
Specific Gravity, Urine: >1.03 — ABNORMAL HIGH (ref 1.005–1.030)
pH: 6 (ref 5.0–8.0)

## 2020-02-20 MED ORDER — ONDANSETRON 4 MG PO TBDP: 4.0000 mg | ORAL_TABLET | Freq: Three times a day (TID) | ORAL | 0 refills | Status: DC | PRN

## 2020-02-20 MED ORDER — SODIUM CHLORIDE 0.9 % IV BOLUS
1000.0000 mL | Freq: Once | INTRAVENOUS | Status: AC
  Administered 2020-02-20: 1000 mL via INTRAVENOUS

## 2020-02-20 MED ORDER — IOHEXOL 300 MG/ML  SOLN
100.0000 mL | Freq: Once | INTRAMUSCULAR | Status: AC | PRN
  Administered 2020-02-20: 100 mL via INTRAVENOUS

## 2020-02-20 MED FILL — ONDANSETRON ODT 4 MG TABLET: 4 | 7 days supply | Qty: 20 | Fill #0

## 2020-02-20 NOTE — ED Notes (Signed)
Pt given ginger ale.

## 2020-02-20 NOTE — Discharge Instructions (Signed)
You were seen in the emergency department for generalized abdominal pain nausea vomiting diarrhea.  Your lab work did not show any significant abnormalities but your CAT scan showed some thickening of your small bowel that will need follow-up with gastroenterology.  Please start with a clear liquid diet and advance as tolerated.  We are providing you with a prescription for some nausea medication.  If you experience any high fevers or worsening symptoms please return to the emergency department.

## 2020-02-20 NOTE — ED Provider Notes (Signed)
[facility] EMERGENCY DEPARTMENT Provider Note   CSN: [id number] Arrival date & time: 02/20/20  1023     History Chief Complaint  Patient presents with  . Abdominal Pain    Alicia [name] is a 39 y.o. female.  She is complaining of upper abdominal pain that started 3 days ago with nausea vomiting diarrhea.  No blood from above or below.  The nausea and vomiting and diarrhea have improved although now her pain is more generalized.  No fevers chills cough shortness of breath.  Urinary symptoms vaginal bleeding or discharge.  Last menstrual period was about 3 weeks ago.  No sick contacts or recent travel.  The history is provided by the patient.  Abdominal Pain Pain location:  Generalized Pain quality: cramping   Pain radiates to:  Does not radiate Pain severity:  Moderate Onset quality:  Gradual Duration:  3 days Timing:  Intermittent Progression:  Partially resolved Chronicity:  New Context: not recent travel, not sick contacts, not suspicious food intake and not trauma   Relieved by:  Nothing Worsened by:  Nothing Ineffective treatments:  None tried Associated symptoms: belching, diarrhea, nausea and vomiting   Associated symptoms: no chest pain, no constipation, no cough, no dysuria, no fever, no hematemesis, no hematochezia, no hematuria, no shortness of breath, no sore throat, no vaginal bleeding and no vaginal discharge        Past Medical History:  Diagnosis Date  . Asthma   . Kidney infection   . UTI (lower urinary tract infection)     There are no problems to display for this patient.   History reviewed. No pertinent surgical history.   OB History   No obstetric history on file.     Family History  Problem Relation Age of Onset  . Arthritis Mother   . High blood pressure Maternal Grandmother     Social History   Tobacco Use  . Smoking status: Never Smoker  . Smokeless tobacco: Never Used  Substance Use Topics  . Alcohol use: Yes      Comment: socially  . Drug use: No    Home Medications Prior to Admission medications   Medication Sig Start Date End Date Taking? Authorizing Provider  benzonatate (TESSALON) 100 MG capsule Take 1 capsule (100 mg total) by mouth every 8 (eight) hours. 06/28/15   Santiago Glad, PA-C  ciprofloxacin (CIPRO) 500 MG tablet Take 1 tablet (500 mg total) by mouth 2 (two) times daily. 01/03/13   Donato Schultz, DO  hydrochlorothiazide (HYDRODIURIL) 12.5 MG tablet Take 1 tablet (12.5 mg total) by mouth daily. 12/29/12   Donato Schultz, DO  ibuprofen (ADVIL,MOTRIN) 800 MG tablet Take 800 mg by mouth every 8 (eight) hours as needed. For pain     [provider]  IUD's (PARAGARD INTRAUTERINE COPPER) IUD IUD 1 each by Intrauterine route once.      [provider]  omeprazole (PRILOSEC) 20 MG capsule Take 1 capsule (20 mg total) by mouth daily. 08/03/19   Palumbo, April, MD    Allergies    Patient has no known allergies.  Review of Systems   Review of Systems  Constitutional: Negative for fever.  HENT: Negative for sore throat.   Eyes: Negative for visual disturbance.  Respiratory: Negative for cough and shortness of breath.   Cardiovascular: Negative for chest pain.  Gastrointestinal: Positive for abdominal pain, diarrhea, nausea and vomiting. Negative for constipation, hematemesis and hematochezia.  Genitourinary: Negative for dysuria, hematuria,  vaginal bleeding and vaginal discharge.  Musculoskeletal: Negative for neck pain.  Skin: Negative for rash.  Neurological: Negative for headaches.    Physical Exam Updated Vital Signs BP 113/71   Pulse 90   Temp 98.9 F (37.2 C) (Oral)   Resp 18   Ht 5\' 3"  (1.6 m)   Wt 72.6 kg   LMP 02/08/2020   SpO2 100%   BMI 28.34 kg/m   Physical Exam Vitals and nursing note reviewed.  Constitutional:      General: She is not in acute distress.    Appearance: Normal appearance. She is well-developed.  HENT:      Head: Normocephalic and atraumatic.  Eyes:     Conjunctiva/sclera: Conjunctivae normal.  Cardiovascular:     Rate and Rhythm: Normal rate and regular rhythm.     Heart sounds: No murmur heard.   Pulmonary:     Effort: Pulmonary effort is normal. No respiratory distress.     Breath sounds: Normal breath sounds.  Abdominal:     Palpations: Abdomen is soft.     Tenderness: There is no abdominal tenderness. There is no guarding or rebound.  Musculoskeletal:        General: No deformity or signs of injury. Normal range of motion.     Cervical back: Neck supple.  Skin:    General: Skin is warm and dry.     Capillary Refill: Capillary refill takes less than 2 seconds.  Neurological:     General: No focal deficit present.     Mental Status: She is alert.     ED Results / Procedures / Treatments   Labs (all labs ordered are listed, but only abnormal results are displayed) Labs Reviewed  COMPREHENSIVE METABOLIC PANEL - Abnormal; Notable for the following components:      Result Value   CO2 21 (*)    All other components within normal limits  URINALYSIS, ROUTINE W REFLEX MICROSCOPIC - Abnormal; Notable for the following components:   APPearance CLOUDY (*)    Specific Gravity, Urine >1.030 (*)    Hgb urine dipstick SMALL (*)    Bilirubin Urine SMALL (*)    All other components within normal limits  URINALYSIS, MICROSCOPIC (REFLEX) - Abnormal; Notable for the following components:   Bacteria, UA MANY (*)    All other components within normal limits  LIPASE, BLOOD  CBC  PREGNANCY, URINE    EKG None  Radiology CT Abdomen Pelvis W Contrast  Result Date: 02/20/2020 CLINICAL DATA:  39 year old female with epigastric abdominal pain for 3 days with nausea. Diarrhea today and pressure in the pelvis. EXAM: CT ABDOMEN AND PELVIS WITH CONTRAST TECHNIQUE: Multidetector CT imaging of the abdomen and pelvis was performed using the standard protocol following bolus administration of  intravenous contrast. CONTRAST:  100mL OMNIPAQUE IOHEXOL 300 MG/ML  SOLN COMPARISON:  Chest radiographs 08/02/2019. FINDINGS: Lower chest: Trace right pleural effusion.  Otherwise negative. Hepatobiliary: Small volume perihepatic free fluid with simple fluid density. Liver and gallbladder are within normal limits. No bile duct enlargement. Pancreas: Negative. Spleen: Trace perisplenic fluid, otherwise negative spleen. Adrenals/Urinary Tract: Normal adrenal glands. Kidneys symmetrically enhance and appear normal. Proximal ureters are decompressed. Diminutive and unremarkable urinary bladder. Multiple bilateral pelvic phleboliths. No definite urinary calculus. Stomach/Bowel: Stomach, duodenum, and proximal small bowel loops appear within normal limits. But there are multiple abnormally thickened and inflamed small bowel loops throughout the bilateral lower abdomen extending to the terminal ileum (coronal image 26). Continuous involvement of the loops.  Associated mesenteric inflammation and associated free fluid. No upstream dilated bowel. The cecum and ascending colon are decompressed. Large bowel is decompressed throughout the abdomen and pelvis. There is some hyperdense material within the large bowel. It is difficult to exclude mild inflammation throughout the colon, but the retrocecal appendix appears to remain normal. No free air. Vascular/Lymphatic: Major arterial structures appear patent and normal. The portal venous system timing is early, but the portal vein appears to remain patent. Reproductive: Rim enhancing but simple appearing left ovarian cyst measuring 2.6 cm is likely physiologic (series 3, image 70). Evidence of a uterine fibroid in the right body estimated at 37 mm diameter. Other: Small to moderate volume of free fluid in the pelvis with simple fluid density. Musculoskeletal: Negative. IMPRESSION: 1. Thickened and inflamed small bowel loops throughout the bilateral lower abdomen to the terminal  ileum. Associated mesenteric inflammation and free fluid. Large bowel is decompressed and might also be inflamed throughout. Appendix is normal. Appearance favors inflammatory bowel disease (especially Crohn disease) but an acute infectious enterocolitis is also possible. 2. Trace right pleural effusion also. 3. Uterine fibroid. Suspected physiologic left ovarian cyst/follicle. Electronically Signed   By: Odessa Fleming M.D.   On: 02/20/2020 14:24    Procedures Procedures (including critical care time)  Medications Ordered in ED Medications  sodium chloride 0.9 % bolus 1,000 mL (0 mLs Intravenous Stopped 02/20/20 1519)  iohexol (OMNIPAQUE) 300 MG/ML solution 100 mL (100 mLs Intravenous Contrast Given 02/20/20 1352)    ED Course  I have reviewed the triage vital signs and the nursing notes.  Pertinent labs & imaging results that were available during my care of the patient were reviewed by me and considered in my medical decision making (see chart for details).  Clinical Course as of Feb 19 1649  Mon Feb 20, 2020  1441 Discussed with PA Jennye Moccasin from [facility] gastroenterology.  She said the patient should follow-up in the office but it sounds like maybe this is an acute process that is just slowly resolving and the CT has not caught up to it yet.  Does not recommend antibiotics.   [MB]  1441 Reviewed with patient she is comfortable with plan.  Return instructions discussed.   [MB]    Clinical Course User Index [MB] Terrilee Files, MD   MDM Rules/Calculators/A&P                         This patient complains of generalized abdominal pain nausea vomiting diarrhea; this involves an extensive number of treatment Options and is a complaint that carries with it a high risk of complications and Morbidity. The differential includes gastroenteritis, obstruction, colitis, diverticulitis, appendicitis, PID  I ordered, reviewed and interpreted labs, which included CBC which shows normal white count  normal hemoglobin, chemistries normal other than mildly low bicarb, urinalysis contaminated sample 21-50 epis, LFTs and lipase normal, pregnancy test negative I ordered medication IV fluids I ordered imaging studies which included CT abdomen and pelvis and I independently    visualized and interpreted imaging which showed small bowel thickening no evidence of obstruction Previous records obtained and reviewed in epic, no recent visits I consulted gastroenterology, PA [place]rib[place] and discussed lab and imaging findings  Critical Interventions: None  After the interventions stated above, I reevaluated the patient and found patient's symptoms to be improved.  She still has a benign abdominal exam.  Reviewed recommendations from GI and she is comfortable plan for discharge  and outpatient follow-up.  Return instructions discussed.  Final Clinical Impression(s) / ED Diagnoses Final diagnoses:  Generalized abdominal pain  Nausea vomiting and diarrhea    Rx / DC Orders ED Discharge Orders         Ordered    ondansetron (ZOFRAN ODT) 4 MG disintegrating tablet  Every 8 hours PRN        02/20/20 1444           Terrilee Files, MD 02/20/20 1653

## 2020-02-20 NOTE — ED Triage Notes (Signed)
Epigastric pain x 3 days, nausea 3 days ago. Diarrhea today.

## 2020-11-22 ENCOUNTER — Emergency Department (HOSPITAL_BASED_OUTPATIENT_CLINIC_OR_DEPARTMENT_OTHER)
Admission: EM | Admit: 2020-11-22 | Discharge: 2020-11-22 | Disposition: A | Discharge status: Home / Self Care | Payer: 59 | Attending: Emergency Medicine | Admitting: Emergency Medicine

## 2020-11-22 ENCOUNTER — Other Ambulatory Visit: Payer: Self-pay

## 2020-11-22 ENCOUNTER — Encounter (HOSPITAL_BASED_OUTPATIENT_CLINIC_OR_DEPARTMENT_OTHER): Payer: Self-pay

## 2020-11-22 DIAGNOSIS — J45909 Unspecified asthma, uncomplicated: Secondary | ICD-10-CM | POA: Insufficient documentation

## 2020-11-22 DIAGNOSIS — G5601 Carpal tunnel syndrome, right upper limb: Secondary | ICD-10-CM | POA: Insufficient documentation

## 2020-11-22 DIAGNOSIS — M79601 Pain in right arm: Secondary | ICD-10-CM | POA: Diagnosis present

## 2020-11-22 MED ORDER — IBUPROFEN 800 MG PO TABS: 800.0000 mg | ORAL_TABLET | Freq: Three times a day (TID) | ORAL | 0 refills | Status: DC | PRN

## 2020-11-22 MED ORDER — PREDNISONE 10 MG PO TABS: 20.0000 mg | ORAL_TABLET | Freq: Every day | ORAL | 0 refills | Status: AC

## 2020-11-22 MED ORDER — PREDNISONE 10 MG PO TABS
20.0000 mg | ORAL_TABLET | Freq: Every day | ORAL | 0 refills | Status: DC
  Filled 2020-11-22: qty 8, 4d supply, fill #0

## 2020-11-22 MED ORDER — IBUPROFEN 800 MG PO TABS
800.0000 mg | ORAL_TABLET | Freq: Three times a day (TID) | ORAL | 0 refills | Status: DC | PRN
  Filled 2020-11-22: qty 21, 7d supply, fill #0

## 2020-11-22 NOTE — Discharge Instructions (Addendum)
Were seen in the emergency department today with wrist and arm pain.  I suspect this is related to carpal tunnel syndrome.  I am starting you on anti-inflammatory medications and placing you in a wrist brace for comfort.  Please remove the brace periodically throughout the day to move the wrist.  Follow-up with the orthopedic doctor listed.  Return to the emergency department any new or suddenly worsening symptoms.

## 2020-11-22 NOTE — ED Triage Notes (Addendum)
Complains of sharp pain in wrist that radiated up to arm, Right side.  Reports she types a lot.  But then had an moment of blurred vision.  Vision has resolved. Complains of weakness to right arm but moving without difficulty and complaining of pain.

## 2020-11-22 NOTE — ED Provider Notes (Signed)
Emergency Department Provider Note   I have reviewed the triage vital signs and the nursing notes.   HISTORY  Chief Complaint Joint Pain   HPI Alicia [name] is a 40 y.o. female presents to the ED w/ sharp pain and tingling in the right forearm. Pain and weakness noted in the right arm. Patient had momentary blurry vision which was brief, without pain, without HA, and no associated symptoms. Episodes was seconds Imunique Samad. No fever. 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 Pain worse with movement.    Past Medical History:  Diagnosis Date   Asthma    Kidney infection    UTI (lower urinary tract infection)     Patient Active Problem List   Diagnosis Date Noted   Carpal tunnel syndrome of right wrist 11/27/2020    History reviewed. No pertinent surgical history.  Allergies Patient has no known allergies.  Family History  Problem Relation Age of Onset   Arthritis Mother    High blood pressure Maternal Grandmother     Social History Social History   Tobacco Use   Smoking status: Never   Smokeless tobacco: Never  Substance Use Topics   Alcohol use: Yes    Comment: socially   Drug use: No    Review of Systems  Constitutional: No fever/chills Eyes: Positive visual changes. ENT: No sore throat. Cardiovascular: Denies chest pain. Respiratory: Denies shortness of breath. Gastrointestinal: No abdominal pain.  No nausea, no vomiting.  No diarrhea.  No constipation. Genitourinary: Negative for dysuria. Musculoskeletal: Negative for back pain. Pain and tingling in the right forearm.  Skin: Negative for rash. Neurological: Negative for headaches, focal weakness or numbness.  10-point ROS otherwise negative.  ____________________________________________   PHYSICAL EXAM:  VITAL SIGNS: ED Triage Vitals  Enc Vitals Group     BP 11/22/20 1853 134/86     Pulse Rate 11/22/20 1853 75     Resp 11/22/20 1853 16     Temp 11/22/20 1853 98.5 F  (36.9 C)     Temp Source 11/22/20 1853 Oral     SpO2 11/22/20 1853 100 %     Weight 11/22/20 1849 154 lb (69.9 kg)     Height 11/22/20 1849 5\' 4"  (1.626 m)   Constitutional: Alert and oriented. Well appearing and in no acute distress. Eyes: Conjunctivae are normal. PERRL. EOMI. Head: Atraumatic. Nose: No congestion/rhinnorhea. Mouth/Throat: Mucous membranes are moist.  Neck: No stridor.  Cardiovascular: Normal rate, regular rhythm. Normal radial pulses.  Respiratory: Normal respiratory effort.  Gastrointestinal: No distention.  Musculoskeletal: Positive Phalen's test. No joint swelling and redness. Normal grip strength but some pain with movement.  Neurologic:  Normal speech and language. No gross focal neurologic deficits are appreciated. Normal strength and sensation in the bilateral upper extremities.  Skin:  Skin is warm, dry and intact. No rash noted.   ____________________________________________  RADIOLOGY  None  ____________________________________________   PROCEDURES  Procedure(s) performed:   Procedures  None ____________________________________________   INITIAL IMPRESSION / ASSESSMENT AND PLAN / ED COURSE  Pertinent labs & imaging results that were available during my care of the patient were reviewed by me and considered in my medical decision making (see chart for details).   Patient presents to the ED with right forearm pain, tingling, and weakness. Weakness seems 2/2 pain rather than true weakness. Exam not consistent with CVA or radicular pain. Tingling is subjective. Momentary vision change not consistent with CVA, migraine, retinal detachment. Plan for wrist  brace, NSAIDs, and ortho follow up for presumed carpal tunnel. Provided a work note for rest as well.    ____________________________________________  FINAL CLINICAL IMPRESSION(S) / ED DIAGNOSES  Final diagnoses:  Carpal tunnel syndrome of right wrist    Note:  This document was prepared  using Dragon voice recognition software and may include unintentional dictation errors.  Alona Bene, MD, [facility]    Kaliah Haddaway, Arlyss Repress, MD 11/27/20 [phone]

## 2020-11-23 ENCOUNTER — Other Ambulatory Visit (HOSPITAL_BASED_OUTPATIENT_CLINIC_OR_DEPARTMENT_OTHER): Payer: Self-pay

## 2020-11-26 ENCOUNTER — Other Ambulatory Visit: Payer: Self-pay

## 2020-11-26 ENCOUNTER — Encounter: Payer: Self-pay | Admitting: Family Medicine

## 2020-11-26 ENCOUNTER — Ambulatory Visit (INDEPENDENT_AMBULATORY_CARE_PROVIDER_SITE_OTHER): Payer: 59 | Admitting: Family Medicine

## 2020-11-26 VITALS — BP 106/68 | Ht 64.5 in | Wt 154.0 lb

## 2020-11-26 DIAGNOSIS — G5601 Carpal tunnel syndrome, right upper limb: Secondary | ICD-10-CM | POA: Diagnosis not present

## 2020-11-26 NOTE — Patient Instructions (Signed)
Nice to meet you Please try the brace at night during work if you have symptoms  Please try the exercises   Please send me a message in MyChart with any questions or updates.  Please see me back in 2-3 weeks.   --Dr. Jordan Likes

## 2020-11-26 NOTE — Progress Notes (Signed)
  Alicia [name] - 40 y.o. female MRN [id number]  Date of birth: 1980/12/22  SUBJECTIVE:  Including CC & ROS.  No chief complaint on file.   Alicia [name] is a 40 y.o. female that is presenting with acute right hand pain.  Symptoms started over the past few days.  She was seen in the emergency department and placed in a brace.  She works with her hands and types a lot and seems to exacerbate her pain.   Review of Systems See HPI   HISTORY: Past Medical, Surgical, Social, and Family History Reviewed & Updated per EMR.   Pertinent Historical Findings include:  Past Medical History:  Diagnosis Date   Asthma    Kidney infection    UTI (lower urinary tract infection)     History reviewed. No pertinent surgical history.  Family History  Problem Relation Age of Onset   Arthritis Mother    High blood pressure Maternal Grandmother     Social History   Socioeconomic History   Marital status: Single    Spouse name: Not on file   Number of children: Not on file   Years of education: Not on file   Highest education level: Not on file  Occupational History   Occupation: CUST SERV    Employer: [employer]    Comment: [employer]  Tobacco Use   Smoking status: Never   Smokeless tobacco: Never  Substance and Sexual Activity   Alcohol use: Yes    Comment: socially   Drug use: No   Sexual activity: Yes    Partners: Male    Birth control/protection: None  Other Topics Concern   Not on file  Social History Narrative   Exercise-- no   Social Determinants of Health   Financial Resource Strain: Not on file  Food Insecurity: Not on file  Transportation Needs: Not on file  Physical Activity: Not on file  Stress: Not on file  Social Connections: Not on file  Intimate Partner Violence: Not on file     PHYSICAL EXAM:  VS: BP 106/68 (BP Location: Left Arm, Patient Position: Sitting, Cuff Size: Normal)   Ht 5' 4.5" (1.638 m)   Wt 154 lb (69.9 kg)   BMI 26.03 kg/m  Physical  Exam Gen: NAD, alert, cooperative with exam, well-appearing MSK:  Right hand: No signs of atrophy. Normal grip strength. Positive Tinel's at the wrist. Neurovascularly intact     ASSESSMENT & PLAN:   Carpal tunnel syndrome of right wrist Seems most consistent for carpal tunnel.  She does a lot of typing with work. -Counseled on home exercise therapy and supportive care. -Has finished prednisone. -Counseled on the brace. -Could consider injection or physical therapy.

## 2020-11-27 DIAGNOSIS — G5601 Carpal tunnel syndrome, right upper limb: Secondary | ICD-10-CM | POA: Insufficient documentation

## 2020-11-27 NOTE — Assessment & Plan Note (Signed)
Seems most consistent for carpal tunnel.  She does a lot of typing with work. -Counseled on home exercise therapy and supportive care. -Has finished prednisone. -Counseled on the brace. -Could consider injection or physical therapy.

## 2020-12-18 ENCOUNTER — Other Ambulatory Visit: Payer: Self-pay

## 2020-12-18 ENCOUNTER — Ambulatory Visit: Payer: 59 | Admitting: Family Medicine

## 2020-12-18 NOTE — Progress Notes (Deleted)
  Alicia [name] - 40 y.o. female MRN [id number]  Date of birth: Aug 29, 1980  SUBJECTIVE:  Including CC & ROS.  No chief complaint on file.   Alicia [name] is a 40 y.o. female that is  ***.  ***   Review of Systems See HPI   HISTORY: Past Medical, Surgical, Social, and Family History Reviewed & Updated per EMR.   Pertinent Historical Findings include:  Past Medical History:  Diagnosis Date   Asthma    Kidney infection    UTI (lower urinary tract infection)     No past surgical history on file.  Family History  Problem Relation Age of Onset   Arthritis Mother    High blood pressure Maternal Grandmother     Social History   Socioeconomic History   Marital status: Single    Spouse name: Not on file   Number of children: Not on file   Years of education: Not on file   Highest education level: Not on file  Occupational History   Occupation: CUST SERV    Employer: [employer]    Comment: [employer]  Tobacco Use   Smoking status: Never   Smokeless tobacco: Never  Substance and Sexual Activity   Alcohol use: Yes    Comment: socially   Drug use: No   Sexual activity: Yes    Partners: Male    Birth control/protection: None  Other Topics Concern   Not on file  Social History Narrative   Exercise-- no   Social Determinants of Health   Financial Resource Strain: Not on file  Food Insecurity: Not on file  Transportation Needs: Not on file  Physical Activity: Not on file  Stress: Not on file  Social Connections: Not on file  Intimate Partner Violence: Not on file     PHYSICAL EXAM:  VS: There were no vitals taken for this visit. Physical Exam Gen: NAD, alert, cooperative with exam, well-appearing MSK:  ***      ASSESSMENT & PLAN:   No problem-specific Assessment & Plan notes found for this encounter.

## 2021-09-07 IMAGING — CR DG CHEST 2V
2 series · 2 of 2 positions shown · non-contrast
Comparison: 06/28/2015

CLINICAL DATA: Chest pain and shortness of breath

EXAM:
CHEST - 2 VIEW

[w chest pa]
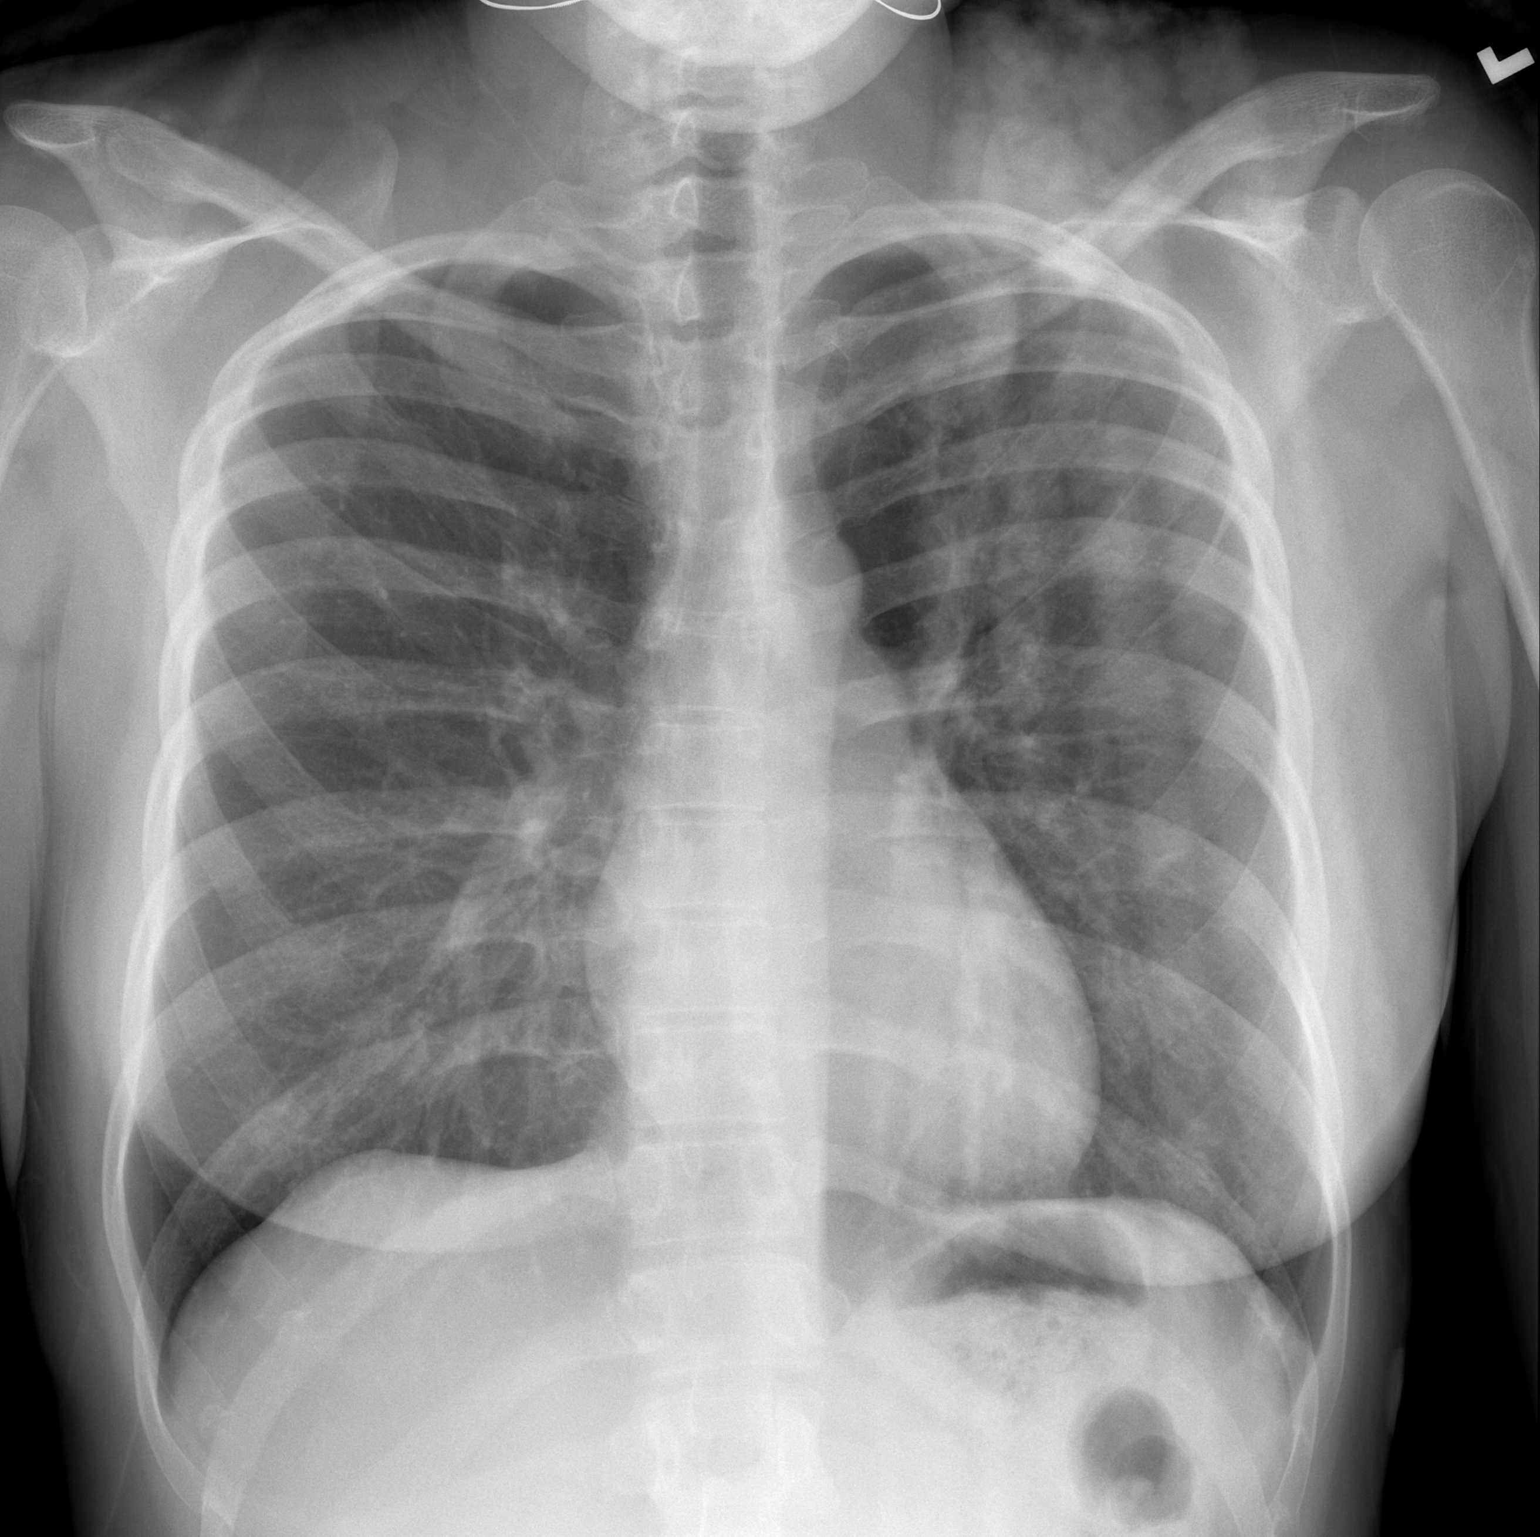

[w chest lat]
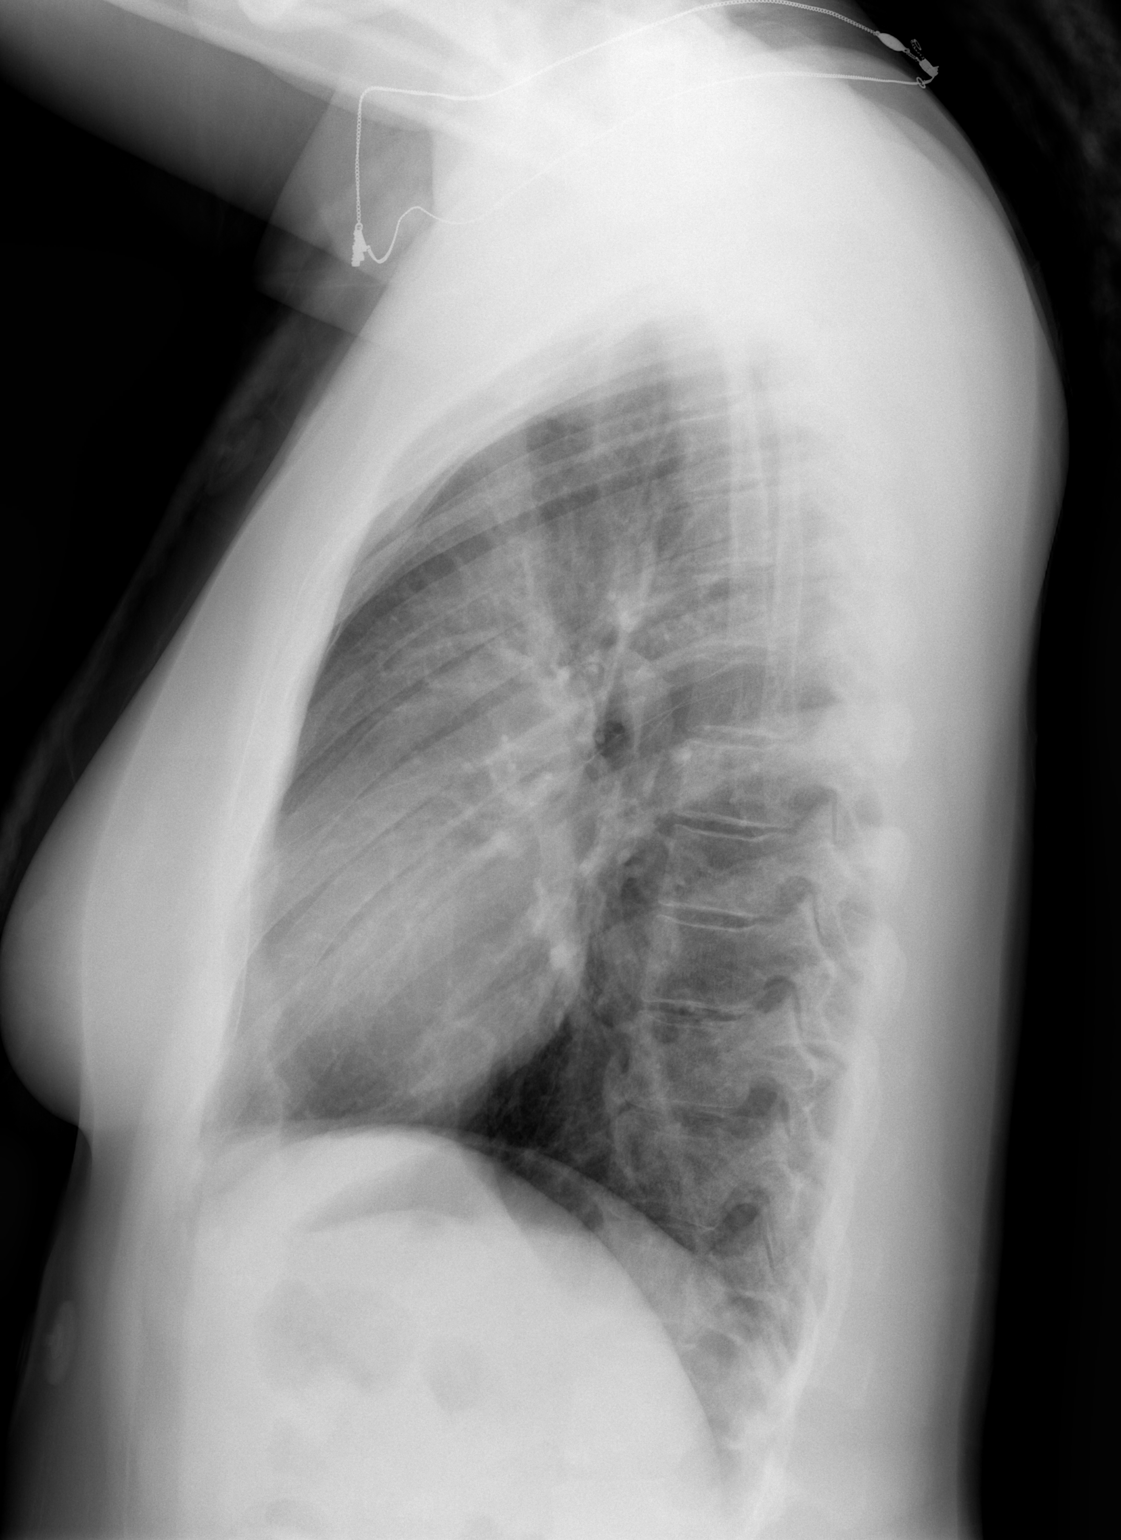

[2 of 2 positions shown; findings below may reference images not displayed]

FINDINGS: The heart size and mediastinal contours are within normal limits.
Both lungs are clear. The visualized skeletal structures are
unremarkable.
IMPRESSION: No active cardiopulmonary disease.

## 2022-03-28 IMAGING — CT CT ABD-PELV W/ CM
2 of 4 series · 15 of 46 positions shown, 17 images · IV contrast (Omnipaque)
Comparison: Chest radiographs 08/02/2019.

CLINICAL DATA: 38-year-old female with epigastric abdominal pain
for 3 days with nausea. Diarrhea today and pressure in the pelvis.

EXAM:
CT ABDOMEN AND PELVIS WITH CONTRAST
TECHNIQUE: Multidetector CT imaging of the abdomen and pelvis was performed
using the standard protocol following bolus administration of
intravenous contrast.
CONTRAST:  100mL OMNIPAQUE IOHEXOL 300 MG/ML  SOLN

[Series 3: axial st · axial · 0.85mm/px · z∈[-477,-57]mm · 12 of 92 slices shown, 14 images]
[im 4/92  soft-tissue]
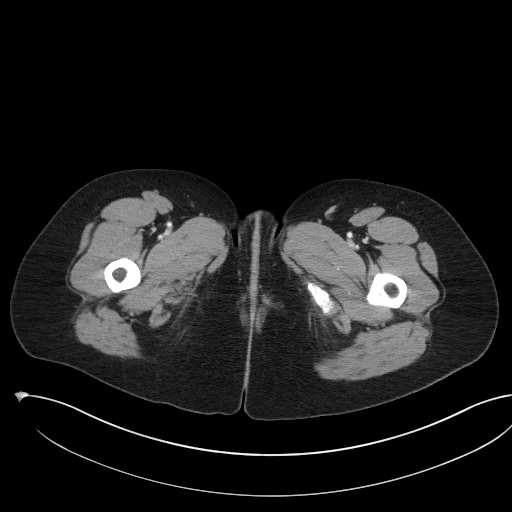
[im 4/92  bone]
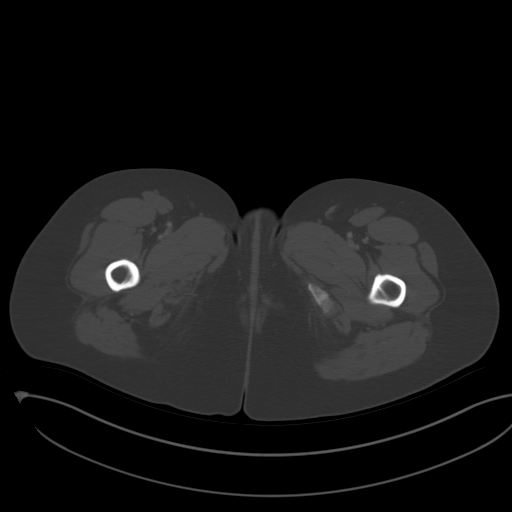
[im 12/92  soft-tissue]
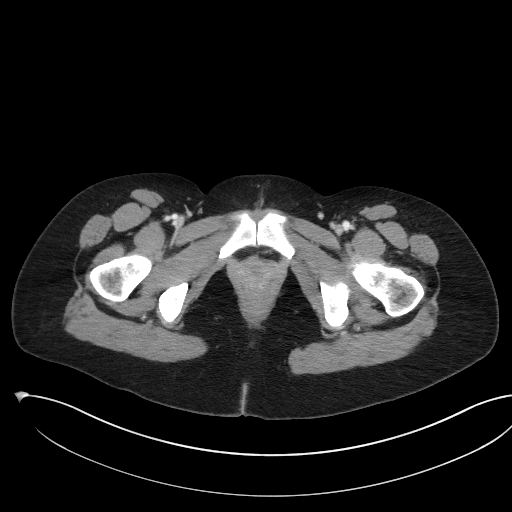
[im 20/92  soft-tissue]
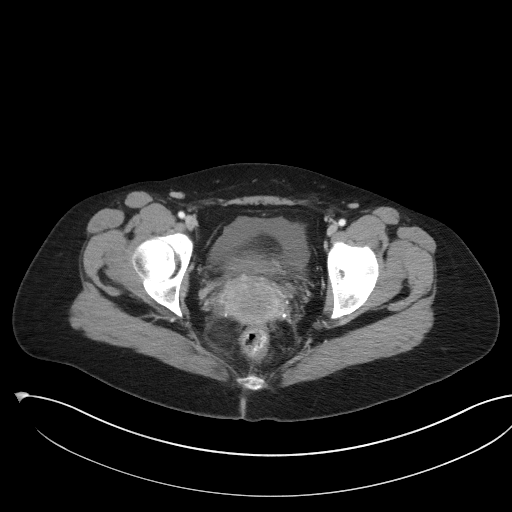
[im 28/92  soft-tissue]
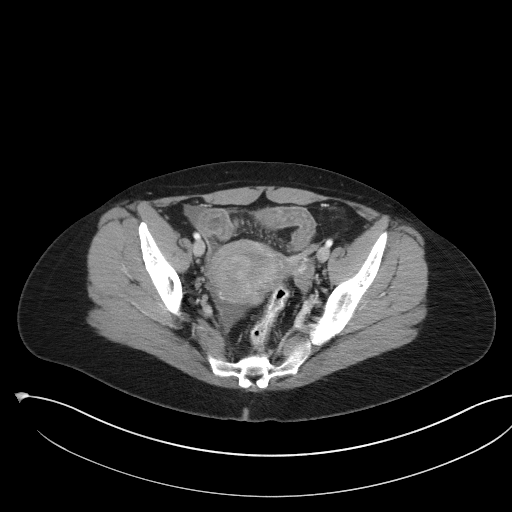
[im 36/92  soft-tissue]
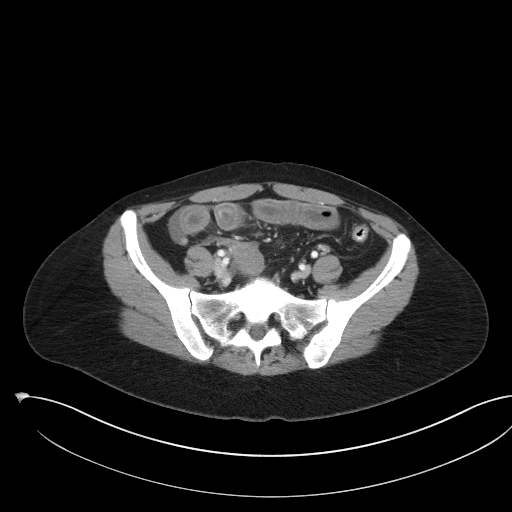
[im 44/92  soft-tissue]
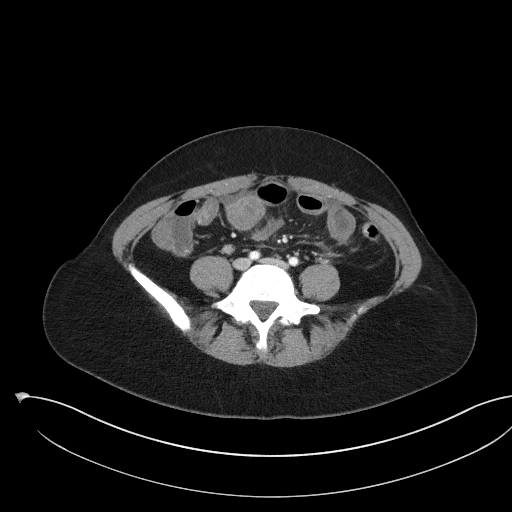
[im 48/92  soft-tissue]
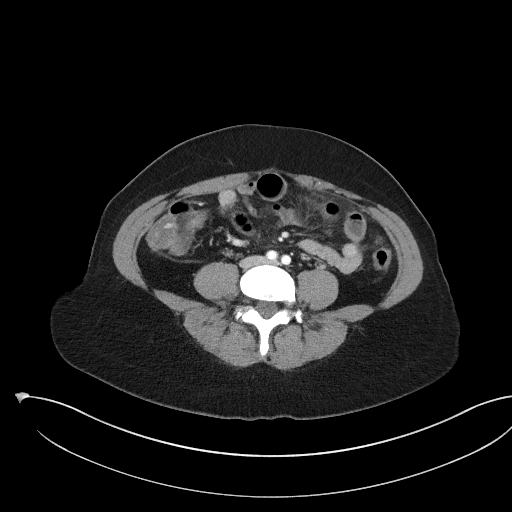
[im 56/92  soft-tissue]
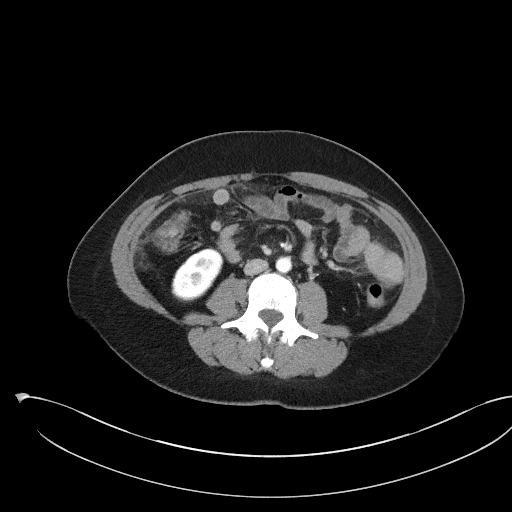
[im 64/92  soft-tissue]
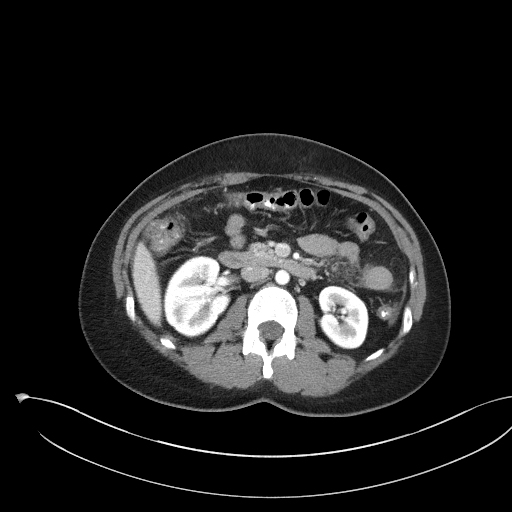
[im 64/92  bone]
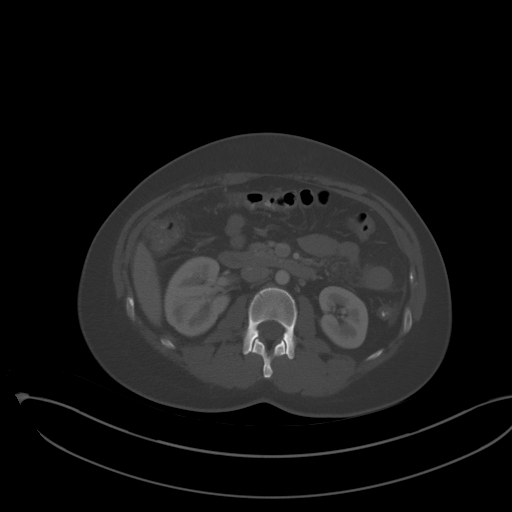
[im 72/92  soft-tissue]
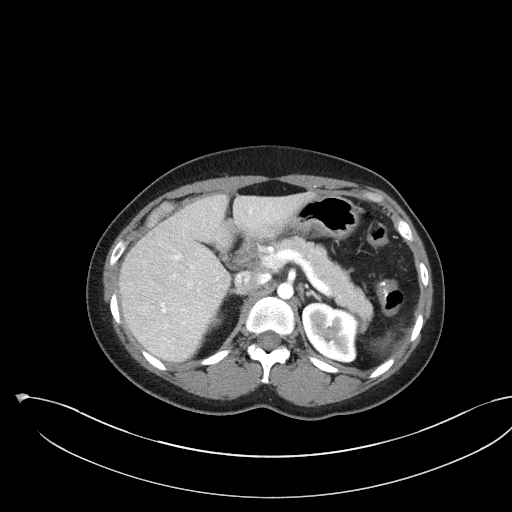
[im 80/92  soft-tissue]
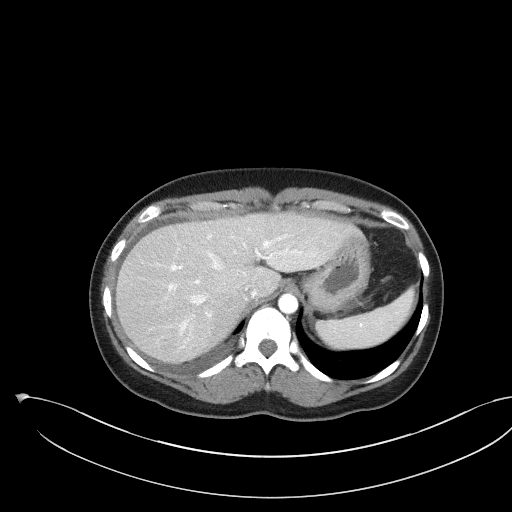
[im 88/92  soft-tissue]
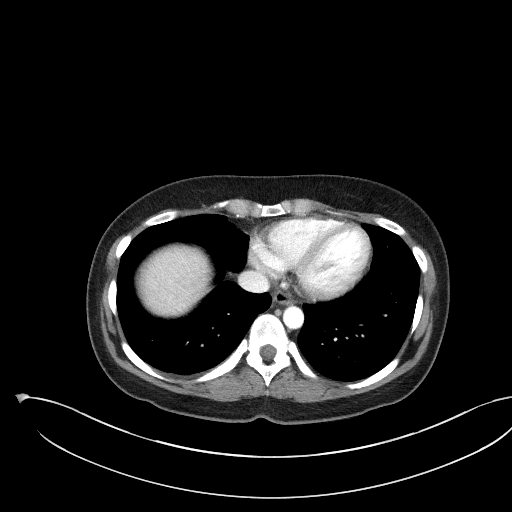

[Series 5: coronal st · coronal · 0.74mm/px · 3 of 84 slices shown]
[im 28/84  soft-tissue]
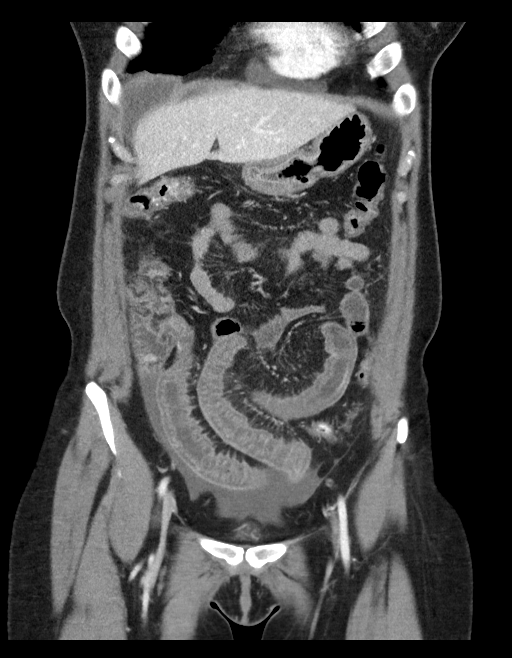
[im 37/84  soft-tissue]
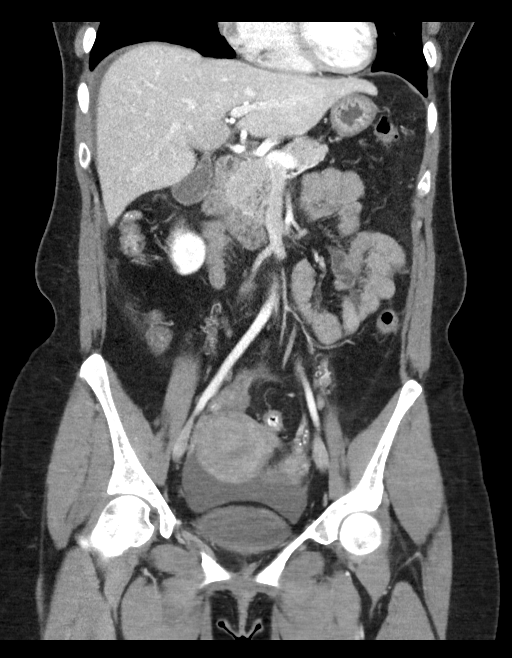
[im 47/84  soft-tissue]
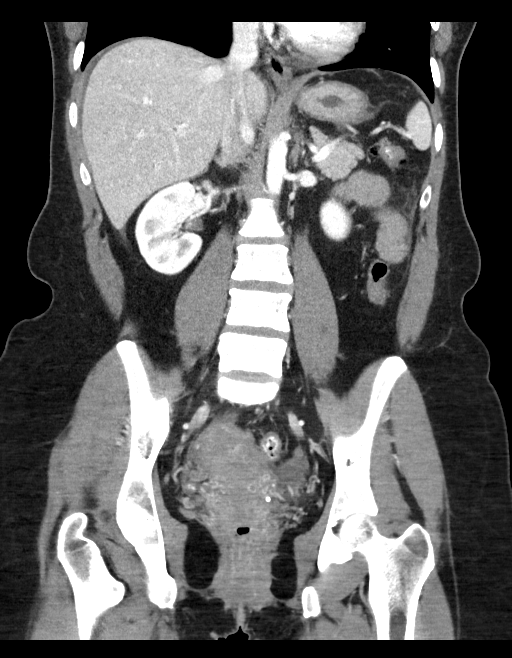

[15 of 46 positions shown; findings below may reference images not displayed]

FINDINGS: Lower chest: Trace right pleural effusion.  Otherwise negative.

Hepatobiliary: Small volume perihepatic free fluid with simple fluid
density. Liver and gallbladder are within normal limits. No bile
duct enlargement.

Pancreas: Negative.

Spleen: Trace perisplenic fluid, otherwise negative spleen.

Adrenals/Urinary Tract: Normal adrenal glands. Kidneys symmetrically
enhance and appear normal. Proximal ureters are decompressed.
Diminutive and unremarkable urinary bladder. Multiple bilateral
pelvic phleboliths. No definite urinary calculus.

Stomach/Bowel: Stomach, duodenum, and proximal small bowel loops
appear within normal limits.

But there are multiple abnormally thickened and inflamed small bowel
loops throughout the bilateral lower abdomen extending to the
terminal ileum (coronal image 26). Continuous involvement of the
loops. Associated mesenteric inflammation and associated free fluid.
No upstream dilated bowel.

The cecum and ascending colon are decompressed. Large bowel is
decompressed throughout the abdomen and pelvis. There is some
hyperdense material within the large bowel. It is difficult to
exclude mild inflammation throughout the colon, but the retrocecal
appendix appears to remain normal.

No free air.

Vascular/Lymphatic: Major arterial structures appear patent and
normal. The portal venous system timing is early, but the portal
vein appears to remain patent.

Reproductive: Rim enhancing but simple appearing left ovarian cyst
measuring 2.6 cm is likely physiologic (series 3, image 70).
Evidence of a uterine fibroid in the right body estimated at 37 mm
diameter.

Other: Small to moderate volume of free fluid in the pelvis with
simple fluid density.

Musculoskeletal: Negative.
IMPRESSION: 1. Thickened and inflamed small bowel loops throughout the bilateral
lower abdomen to the terminal ileum.
Associated mesenteric inflammation and free fluid.
Large bowel is decompressed and might also be inflamed throughout.
Appendix is normal.
Appearance favors inflammatory bowel disease (especially Crohn
disease) but an acute infectious enterocolitis is also possible.

2. Trace right pleural effusion also.

3. Uterine fibroid. Suspected physiologic left ovarian
cyst/follicle.

## 2022-08-05 ENCOUNTER — Encounter: Payer: Self-pay | Admitting: *Deleted

## 2022-12-06 ENCOUNTER — Emergency Department (HOSPITAL_BASED_OUTPATIENT_CLINIC_OR_DEPARTMENT_OTHER): Payer: 59

## 2022-12-06 ENCOUNTER — Emergency Department (HOSPITAL_BASED_OUTPATIENT_CLINIC_OR_DEPARTMENT_OTHER)
Admission: EM | Admit: 2022-12-06 | Discharge: 2022-12-06 | Disposition: A | Discharge status: Home / Self Care | Payer: 59 | Attending: Emergency Medicine | Admitting: Emergency Medicine

## 2022-12-06 ENCOUNTER — Encounter (HOSPITAL_BASED_OUTPATIENT_CLINIC_OR_DEPARTMENT_OTHER): Payer: Self-pay

## 2022-12-06 ENCOUNTER — Other Ambulatory Visit: Payer: Self-pay

## 2022-12-06 DIAGNOSIS — J45909 Unspecified asthma, uncomplicated: Secondary | ICD-10-CM | POA: Insufficient documentation

## 2022-12-06 DIAGNOSIS — D649 Anemia, unspecified: Secondary | ICD-10-CM | POA: Insufficient documentation

## 2022-12-06 DIAGNOSIS — R42 Dizziness and giddiness: Secondary | ICD-10-CM | POA: Diagnosis present

## 2022-12-06 LAB — CBC WITH DIFFERENTIAL/PLATELET
Abs Immature Granulocytes: 0.02 10*3/uL (ref 0.00–0.07)
Basophils Absolute: 0.1 10*3/uL (ref 0.0–0.1)
Basophils Relative: 1 %
Eosinophils Absolute: 0.2 10*3/uL (ref 0.0–0.5)
Eosinophils Relative: 2 %
HCT: 26.6 % — ABNORMAL LOW (ref 36.0–46.0)
Hemoglobin: 7 g/dL — ABNORMAL LOW (ref 12.0–15.0)
Immature Granulocytes: 0 %
Lymphocytes Relative: 38 %
Lymphs Abs: 3 10*3/uL (ref 0.7–4.0)
MCH: 17 pg — ABNORMAL LOW (ref 26.0–34.0)
MCHC: 26.3 g/dL — ABNORMAL LOW (ref 30.0–36.0)
MCV: 64.7 fL — ABNORMAL LOW (ref 80.0–100.0)
Monocytes Absolute: 0.6 10*3/uL (ref 0.1–1.0)
Monocytes Relative: 8 %
Neutro Abs: 4 10*3/uL (ref 1.7–7.7)
Neutrophils Relative %: 51 %
Platelets: 460 10*3/uL — ABNORMAL HIGH (ref 150–400)
RBC: 4.11 MIL/uL (ref 3.87–5.11)
RDW: 19.6 % — ABNORMAL HIGH (ref 11.5–15.5)
WBC: 7.9 10*3/uL (ref 4.0–10.5)
nRBC: 0 % (ref 0.0–0.2)

## 2022-12-06 LAB — BASIC METABOLIC PANEL
Anion gap: 9 (ref 5–15)
BUN: 16 mg/dL (ref 6–20)
CO2: 21 mmol/L — ABNORMAL LOW (ref 22–32)
Calcium: 9.2 mg/dL (ref 8.9–10.3)
Chloride: 103 mmol/L (ref 98–111)
Creatinine, Ser: 0.74 mg/dL (ref 0.44–1.00)
GFR, Estimated: >60 mL/min (ref >60)
Glucose, Bld: 85 mg/dL (ref 70–99)
Potassium: 3.4 mmol/L — ABNORMAL LOW (ref 3.5–5.1)
Sodium: 133 mmol/L — ABNORMAL LOW (ref 135–145)

## 2022-12-06 LAB — CBG MONITORING, ED: Glucose-Capillary: 91 mg/dL (ref 70–99)

## 2022-12-06 LAB — HCG, QUANTITATIVE, PREGNANCY: hCG, Beta Chain, Quant, S: <1 m[IU]/mL (ref <5)

## 2022-12-06 MED ORDER — SODIUM CHLORIDE 0.9 % IV BOLUS
1000.0000 mL | Freq: Once | INTRAVENOUS | Status: AC
  Administered 2022-12-06: 1000 mL via INTRAVENOUS

## 2022-12-06 MED ORDER — MEDROXYPROGESTERONE ACETATE 10 MG PO TABS: 10.0000 mg | ORAL_TABLET | Freq: Every day | ORAL | 0 refills | Status: DC

## 2022-12-06 MED ORDER — FERROUS SULFATE 325 (65 FE) MG PO TABS: 325.0000 mg | ORAL_TABLET | Freq: Three times a day (TID) | ORAL | 0 refills | Status: DC

## 2022-12-06 MED ORDER — ALUM & MAG HYDROXIDE-SIMETH 200-200-20 MG/5ML PO SUSP
30.0000 mL | Freq: Once | ORAL | Status: AC
  Administered 2022-12-06: 30 mL via ORAL
  Filled 2022-12-06: qty 30

## 2022-12-06 NOTE — ED Notes (Signed)
Checked CBG 91, RN Bethany informed

## 2022-12-06 NOTE — Discharge Instructions (Addendum)
We evaluated you for your lightheadedness.  Your blood test showed significant anemia.  This is almost certainly the cause of your fatigue and lightheadedness.  This is probably due to your heavy menstrual cycles.  I have prescribed you a short course of hormonal treatment which you should take once daily for 10 days.  I have also prescribed iron supplementation.  You are right on the border of needing a blood transfusion, so if you feel worse, or have any increased symptoms, please return or go to [facility] as you may need a transfusion.  Please follow-up with your primary doctor on Monday so that you can have a repeat lab check to make sure that your count is stable.  I would also recommend following up with an OB/GYN.  You can schedule follow-up with the [facility] for [facility].  If you have any new or worsening symptoms such as increasing lightheadedness or dizziness, difficulty breathing, fainting, nausea or vomiting, chest pain, abdominal pain, increasing weakness, or any other new symptoms, please return immediately to the emergency department.

## 2022-12-06 NOTE — ED Notes (Signed)
Discharge instructions reviewed with patient. Patient questions answered and opportunity for education reviewed. Patient voices understanding of discharge instructions with no further questions. Patient ambulatory with steady gait to lobby.  

## 2022-12-06 NOTE — ED Triage Notes (Signed)
Generalized weakness, dizziness, and shob x1 day

## 2022-12-06 NOTE — ED Provider Notes (Signed)
[facility] Provider Note  CSN: [id number] Arrival date & time: 12/06/22 2000  Chief Complaint(s) Dizziness and Shortness of Breath  HPI Alicia [name] is a 42 y.o. female history of asthma presenting to the emergency department with lightheadedness.  Patient reports lightheadedness for the past 3 days.  Reports its mild.  She also reports generalized weakness.  Today she also reported that she had a sensation of food getting stuck when she was eating, has not persistent but reports decreased p.o. intake.  She reports some mild chest discomfort, mild shortness of breath with exertion.  No abdominal pain.  She does endorse heavy menstrual bleeding which is chronic, just started her cycle.  Does not think she could be pregnant.  No dysuria.  No flank pain.  No nausea, vomiting, diarrhea.  No fevers or chills.  No cough, sore throat or runny nose.  Symptoms are mild.  No recent travel or surgeries, has OCP on medication list but reports she has not take this in some time.  Denies any personal history of PE or DVT.  No swelling or leg pain.   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 Authorizing Provider  ferrous sulfate 325 (65 FE) MG tablet Take 1 tablet (325 mg total) by mouth 3 (three) times daily with meals. 12/06/22  Yes Lonell Grandchild, MD  medroxyPROGESTERone (PROVERA) 10 MG tablet Take 1 tablet (10 mg total) by mouth daily for 10 days. 12/06/22 12/16/22 Yes Lonell Grandchild, MD  bismuth subsalicylate (PEPTO BISMOL) 262 MG/15ML suspension Take 30 mLs by mouth every 6 (six) hours as needed for indigestion.     [provider]  ibuprofen  (ADVIL) 800 MG tablet Take 1 tablet (800 mg total) by mouth every 8 (eight) hours as needed for moderate pain. 11/22/20   Long, Arlyss Repress, MD  sertraline (ZOLOFT) 25 MG tablet Take 25 mg by mouth daily. 11/20/22   [provider]  simethicone (MYLICON) 125 MG chewable tablet Chew 125 mg by mouth every 6 (six) hours as needed for flatulence.    [provider]                                                                                                                                    Past Surgical History History reviewed. No pertinent surgical history. Family History Family History  Problem Relation Age of Onset   Arthritis Mother    High blood pressure Maternal Grandmother     Social History Social History   Tobacco Use   Smoking status: Never  Smokeless tobacco: Never  Substance Use Topics   Alcohol use: Yes    Comment: socially   Drug use: No   Allergies Patient has no known allergies.  Review of Systems Review of Systems  All other systems reviewed and are negative.   Physical Exam Vital Signs  I have reviewed the triage vital signs BP 124/73   Pulse 81   Temp 98.4 F (36.9 C) (Oral)   Resp 16   Ht 5\' 5"  (1.651 m)   Wt 64.9 kg   LMP 12/05/2022 (Exact Date)   SpO2 100%   BMI 23.80 kg/m  Physical Exam Vitals and nursing note reviewed.  Constitutional:      General: She is not in acute distress.    Appearance: She is well-developed.  HENT:     Head: Normocephalic and atraumatic.     Mouth/Throat:     Mouth: Mucous membranes are moist.  Eyes:     Pupils: Pupils are equal, round, and reactive to light.  Cardiovascular:     Rate and Rhythm: Normal rate and regular rhythm.     Heart sounds: No murmur heard. Pulmonary:     Effort: Pulmonary effort is normal. No respiratory distress.     Breath sounds: Normal breath sounds.  Abdominal:     General: Abdomen is flat.     Palpations: Abdomen is soft.     Tenderness: There is no abdominal  tenderness.  Musculoskeletal:        General: No tenderness.     Right lower leg: No edema.     Left lower leg: No edema.  Skin:    General: Skin is warm and dry.  Neurological:     General: No focal deficit present.     Mental Status: She is alert. Mental status is at baseline.  Psychiatric:        Mood and Affect: Mood normal.        Behavior: Behavior normal.     ED Results and Treatments Labs (all labs ordered are listed, but only abnormal results are displayed) Labs Reviewed  BASIC METABOLIC PANEL - Abnormal; Notable for the following components:      Result Value   Sodium 133 (*)    Potassium 3.4 (*)    CO2 21 (*)    All other components within normal limits  CBC WITH DIFFERENTIAL/PLATELET - Abnormal; Notable for the following components:   Hemoglobin 7.0 (*)    HCT 26.6 (*)    MCV 64.7 (*)    MCH 17.0 (*)    MCHC 26.3 (*)    RDW 19.6 (*)    Platelets 460 (*)    All other components within normal limits  HCG, QUANTITATIVE, PREGNANCY  CBG MONITORING, ED                                                                                                                          Radiology DG Chest [facility] 1 View  Result  Date: 12/06/2022 CLINICAL DATA:  Generalized weakness, dizziness, shortness of breath EXAM: PORTABLE CHEST 1 VIEW COMPARISON:  Radiographs 08/02/2019 FINDINGS: The heart size and mediastinal contours are within normal limits. Both lungs are clear. The visualized skeletal structures are unremarkable. IMPRESSION: No active disease. Electronically Signed   By: Minerva Fester M.D.   On: 12/06/2022 21:32    Pertinent labs & imaging results that were available during my care of the patient were reviewed by me and considered in my medical decision making (see MDM for details).  Medications Ordered in ED Medications  sodium chloride 0.9 % bolus 1,000 mL (1,000 mLs Intravenous New Bag/Given 12/06/22 2202)  alum & mag hydroxide-simeth (MAALOX/MYLANTA) 200-200-20  MG/5ML suspension 30 mL (30 mLs Oral Given 12/06/22 2200)                                                                                                                                     Procedures Procedures  (including critical care time)  Medical Decision Making / ED Course   MDM:  42 year old female presenting to the emergency department with lightheadedness.  Patient overall well-appearing, her physical examination is unremarkable and her vital signs are normal.  Unclear cause of her symptoms, will check basic labs to evaluate for dehydration, electrolyte disturbance, anemia, occult pregnancy.  Will check chest x-ray given respiratory symptoms although lungs clear on exam.  Her EKG is normal, extremely low concern for ACS or other cardiac pathology.  She is PERC negative.  She has no focal infectious symptoms.  Will give fluids as she reports decreased p.o. intake today.  She reports earlier had sensation of food getting stuck but since then this is resolved.  May have component of GERD so we will give Maalox reassess.  Clinical Course as of 12/06/22 2307  Sat Dec 06, 2022  2304 Labs revealing of hemoglobin of 7.  Discussed further with the patient, she denies any hematemesis, black or tarry stools, bloody stools, nosebleeds, easy bruising or bleeding.  Platelets are not low.  Patient not pancytopenic.  Suspect chronic blood loss anemia.  She has a microcytic anemia.  She does report heavy menstrual cycle.  She does not have an OB/GYN.  Given borderline for blood transfusion discussed with patient, offered transfer to [facility] for blood transfusion, patient reports at this time she would like to try iron supplementation and hormonal therapy for her menstrual cycle.  She is not pregnant.  Advise follow-up with her primary doctor on Monday for repeat of her labs to make sure she is not having worsening anemia.  Low concern for acute drop given significant microcytic anemia,  hemodynamically stable..  Discussed strict return precautions for any worsening symptoms as at that point patient would likely need a blood transfusion. Will discharge patient to home. All questions answered. Patient comfortable with plan of discharge. Return precautions discussed with patient and specified on the after visit  summary.  [WS]    Clinical Course User Index [WS] Lonell Grandchild, MD     Additional history obtained: -Additional history obtained from friend -External records from outside source obtained and reviewed including: Chart review including previous notes, labs, imaging, consultation notes including previous hgb, anemic to 10 at [facility] 05/2021   Lab Tests: -I ordered, reviewed, and interpreted labs.   The pertinent results include:   Labs Reviewed  BASIC METABOLIC PANEL - Abnormal; Notable for the following components:      Result Value   Sodium 133 (*)    Potassium 3.4 (*)    CO2 21 (*)    All other components within normal limits  CBC WITH DIFFERENTIAL/PLATELET - Abnormal; Notable for the following components:   Hemoglobin 7.0 (*)    HCT 26.6 (*)    MCV 64.7 (*)    MCH 17.0 (*)    MCHC 26.3 (*)    RDW 19.6 (*)    Platelets 460 (*)    All other components within normal limits  HCG, QUANTITATIVE, PREGNANCY  CBG MONITORING, ED    Notable for mild hypokalemia, microcytic anemia   EKG   EKG Interpretation Date/Time:  Saturday December 06 2022 20:14:58 EDT Ventricular Rate:  85 PR Interval:  140 QRS Duration:  82 QT Interval:  362 QTC Calculation: 431 R Axis:   70  Text Interpretation: Sinus rhythm Confirmed by Alvino Blood ([id number]) on 12/06/2022 8:31:37 PM         Imaging Studies ordered: I ordered imaging studies including CXR On my interpretation imaging demonstrates no acute process I independently visualized and interpreted imaging. I agree with the radiologist interpretation   Medicines ordered and prescription drug  management: Meds ordered this encounter  Medications   sodium chloride 0.9 % bolus 1,000 mL   alum & mag hydroxide-simeth (MAALOX/MYLANTA) 200-200-20 MG/5ML suspension 30 mL   medroxyPROGESTERone (PROVERA) 10 MG tablet    Sig: Take 1 tablet (10 mg total) by mouth daily for 10 days.    Dispense:  10 tablet    Refill:  0   ferrous sulfate 325 (65 FE) MG tablet    Sig: Take 1 tablet (325 mg total) by mouth 3 (three) times daily with meals.    Dispense:  90 tablet    Refill:  0    -I have reviewed the patients home medicines and have made adjustments as needed   Cardiac Monitoring: The patient was maintained on a cardiac monitor.  I personally viewed and interpreted the cardiac monitored which showed an underlying rhythm of: NSR   Reevaluation: After the interventions noted above, I reevaluated the patient and found that their symptoms have improved  Co morbidities that complicate the patient evaluation  Past Medical History:  Diagnosis Date   Asthma    Kidney infection    UTI (lower urinary tract infection)       Dispostion: Disposition decision including need for hospitalization was considered, and patient discharged from emergency department.    Final Clinical Impression(s) / ED Diagnoses Final diagnoses:  Symptomatic anemia     This chart was dictated using voice recognition software.  Despite best efforts to proofread,  errors can occur which can change the documentation meaning.    Lonell Grandchild, MD 12/06/22 [phone]

## 2022-12-13 ENCOUNTER — Encounter (HOSPITAL_COMMUNITY): Payer: Self-pay | Admitting: Emergency Medicine

## 2022-12-13 ENCOUNTER — Emergency Department (HOSPITAL_COMMUNITY)
Admission: EM | Admit: 2022-12-13 | Discharge: 2022-12-14 | Disposition: A | Discharge status: Home / Self Care | Payer: 59 | Attending: Emergency Medicine | Admitting: Emergency Medicine

## 2022-12-13 ENCOUNTER — Other Ambulatory Visit: Payer: Self-pay

## 2022-12-13 DIAGNOSIS — R531 Weakness: Secondary | ICD-10-CM | POA: Diagnosis not present

## 2022-12-13 DIAGNOSIS — R5383 Other fatigue: Secondary | ICD-10-CM | POA: Insufficient documentation

## 2022-12-13 DIAGNOSIS — D500 Iron deficiency anemia secondary to blood loss (chronic): Secondary | ICD-10-CM

## 2022-12-13 DIAGNOSIS — D649 Anemia, unspecified: Secondary | ICD-10-CM | POA: Diagnosis present

## 2022-12-13 DIAGNOSIS — J45909 Unspecified asthma, uncomplicated: Secondary | ICD-10-CM | POA: Diagnosis not present

## 2022-12-13 DIAGNOSIS — R7989 Other specified abnormal findings of blood chemistry: Secondary | ICD-10-CM | POA: Insufficient documentation

## 2022-12-13 LAB — URINALYSIS, ROUTINE W REFLEX MICROSCOPIC
Bilirubin Urine: NEGATIVE
Glucose, UA: NEGATIVE mg/dL
Hgb urine dipstick: NEGATIVE
Ketones, ur: 5 mg/dL — AB
Leukocytes,Ua: NEGATIVE
Nitrite: NEGATIVE
Protein, ur: NEGATIVE mg/dL
Specific Gravity, Urine: 1.016 (ref 1.005–1.030)
pH: 7 (ref 5.0–8.0)

## 2022-12-13 LAB — CBC
HCT: 28.8 % — ABNORMAL LOW (ref 36.0–46.0)
Hemoglobin: 7.6 g/dL — ABNORMAL LOW (ref 12.0–15.0)
MCH: 18.1 pg — ABNORMAL LOW (ref 26.0–34.0)
MCHC: 26.4 g/dL — ABNORMAL LOW (ref 30.0–36.0)
MCV: 68.4 fL — ABNORMAL LOW (ref 80.0–100.0)
Platelets: 431 10*3/uL — ABNORMAL HIGH (ref 150–400)
RBC: 4.21 MIL/uL (ref 3.87–5.11)
RDW: 22.1 % — ABNORMAL HIGH (ref 11.5–15.5)
WBC: 6.9 10*3/uL (ref 4.0–10.5)
nRBC: 0 % (ref 0.0–0.2)

## 2022-12-13 LAB — BASIC METABOLIC PANEL
Anion gap: 7 (ref 5–15)
BUN: 13 mg/dL (ref 6–20)
CO2: 23 mmol/L (ref 22–32)
Calcium: 8.7 mg/dL — ABNORMAL LOW (ref 8.9–10.3)
Chloride: 104 mmol/L (ref 98–111)
Creatinine, Ser: 0.86 mg/dL (ref 0.44–1.00)
GFR, Estimated: >60 mL/min (ref >60)
Glucose, Bld: 88 mg/dL (ref 70–99)
Potassium: 3 mmol/L — ABNORMAL LOW (ref 3.5–5.1)
Sodium: 134 mmol/L — ABNORMAL LOW (ref 135–145)

## 2022-12-13 LAB — CBG MONITORING, ED: Glucose-Capillary: 91 mg/dL (ref 70–99)

## 2022-12-13 LAB — HCG, SERUM, QUALITATIVE: Preg, Serum: NEGATIVE

## 2022-12-13 NOTE — ED Triage Notes (Signed)
Patient presents due to abnormal labs, weakness and dizziness. On Friday she had labs drawn and found out her RBC was below 7. Her MD told her to come here.   Hx: anemia

## 2022-12-14 NOTE — Discharge Instructions (Signed)
Continue your iron daily.  Follow-up with your primary doctor for recheck of hemoglobin in 2 weeks.  As long as you remain relatively asymptomatic without increased bleeding, shortness of breath, or dizziness, you should be okay to follow-up in 2 weeks.  Otherwise if symptoms worsen, you should be reevaluated earlier.

## 2022-12-14 NOTE — ED Provider Notes (Signed)
[place] EMERGENCY DEPARTMENT AT [facility] Provider Note   CSN: [id number] Arrival date & time: 12/13/22  1912     History  Chief Complaint  Patient presents with   Abnormal Lab   Weakness    Alicia [name] is a 42 y.o. female.  HPI    This is a 42 year old female who presents with concerns for anemia.  Patient followed up with her primary doctor on Friday after being seen in the ED a week ago.  She was experiencing menorrhagia and was started on iron supplementation and Provera.  She followed up on Friday to her primary doctor.  She was having some ongoing fatigue but otherwise had felt better without any ongoing shortness of breath or dizziness.  Hemoglobin in the ED was 7.0.  Repeat hemoglobin in the office was found to be 6.9.  Patient was advised to come to the emergency department.  She states that overall she feels better than her ED visit 1 week ago.  She states that her bleeding has mostly stopped and she has occasional spotting.  She is taking her iron daily.  Aside from fatigue she denies any significant dizziness or shortness of breath.  Home Medications Prior to Admission medications   Medication Sig Start Date End Date Taking? Authorizing Provider  ferrous sulfate 325 (65 FE) MG tablet Take 1 tablet (325 mg total) by mouth 3 (three) times daily with meals. 12/06/22  Yes Lonell Grandchild, MD  ibuprofen (ADVIL) 800 MG tablet Take 1 tablet (800 mg total) by mouth every 8 (eight) hours as needed for moderate pain. 11/22/20  Yes Long, Arlyss Repress, MD  sertraline (ZOLOFT) 25 MG tablet Take 25 mg by mouth daily. 11/20/22  Yes [provider]      Allergies    Patient has no known allergies.    Review of Systems   Review of Systems  Constitutional:  Positive for fatigue.  Respiratory:  Negative for shortness of breath.   Neurological:  Negative for dizziness.  All other systems reviewed and are negative.   Physical Exam Updated Vital Signs BP  107/69 (BP Location: Right Arm)   Pulse 70   Temp 98 F (36.7 C) (Oral)   Resp 17   Ht 1.651 m (5\' 5" )   Wt 64.9 kg   LMP 12/05/2022 (Exact Date)   SpO2 100%   BMI 23.80 kg/m  Physical Exam Vitals and nursing note reviewed.  Constitutional:      Appearance: She is well-developed. She is not ill-appearing.  HENT:     Head: Normocephalic and atraumatic.  Eyes:     Pupils: Pupils are equal, round, and reactive to light.  Cardiovascular:     Rate and Rhythm: Normal rate and regular rhythm.     Heart sounds: Normal heart sounds.  Pulmonary:     Effort: Pulmonary effort is normal. No respiratory distress.     Breath sounds: No wheezing.  Abdominal:     Palpations: Abdomen is soft.     Tenderness: There is no abdominal tenderness.  Musculoskeletal:     Cervical back: Neck supple.  Skin:    General: Skin is warm and dry.  Neurological:     Mental Status: She is alert and oriented to person, place, and time.  Psychiatric:        Mood and Affect: Mood normal.     ED Results / Procedures / Treatments   Labs (all labs ordered are listed, but only abnormal results are  displayed) Labs Reviewed  BASIC METABOLIC PANEL - Abnormal; Notable for the following components:      Result Value   Sodium 134 (*)    Potassium 3.0 (*)    Calcium 8.7 (*)    All other components within normal limits  CBC - Abnormal; Notable for the following components:   Hemoglobin 7.6 (*)    HCT 28.8 (*)    MCV 68.4 (*)    MCH 18.1 (*)    MCHC 26.4 (*)    RDW 22.1 (*)    Platelets 431 (*)    All other components within normal limits  URINALYSIS, ROUTINE W REFLEX MICROSCOPIC - Abnormal; Notable for the following components:   Ketones, ur 5 (*)    All other components within normal limits  HCG, SERUM, QUALITATIVE  CBG MONITORING, ED    EKG None  Radiology No results found.  Procedures Procedures    Medications Ordered in ED Medications - No data to display  ED Course/ Medical Decision  Making/ A&P                                 Medical Decision Making Amount and/or Complexity of Data Reviewed Labs: ordered.   This patient presents to the ED for concern of anemia, this involves an extensive number of treatment options, and is a complaint that carries with it a high risk of complications and morbidity.  I considered the following differential and admission for this acute, potentially life threatening condition.  The differential diagnosis includes symptomatic anemia, chronic anemia, ongoing menorrhagia  MDM:    This is a 42 year old female who presents at the request of her primary physician for possible blood transfusion.  She is nontoxic and vital signs are reassuring.  She is not tachycardic or hypotensive.  Hemoglobin today is actually better than it was 1 week ago increased from 7.0-7.6.  She states that her bleeding has all but stopped.  She is taking her iron as prescribed.  She does report some fatigue but no other significant symptomatic complaints.  Had a long discussion with the patient.  She is young and otherwise healthy.  Her presumed source of bleeding has stopped.  I have offered her a blood transfusion if her symptoms are intolerable although I feel that likely she will have gradual improvement over the next several weeks as long as she does not have recurrent bleeding.  Patient would like to forego transfusion if possible.  She is tolerating iron supplementation.  Orthostatics are negative.  (Labs, imaging, consults)  Labs: I Ordered, and personally interpreted labs.  The pertinent results include: CBC, BMP  Imaging Studies ordered: I ordered imaging studies including none I independently visualized and interpreted imaging. I agree with the radiologist interpretation  Additional history obtained from chart review.  External records from outside source obtained and reviewed including prior primary care notes  Cardiac Monitoring: The patient was  maintained on a cardiac monitor.  If on the cardiac monitor, I personally viewed and interpreted the cardiac monitored which showed an underlying rhythm of: Rhythm  Reevaluation: After the interventions noted above, I reevaluated the patient and found that they have :stayed the same  Social Determinants of Health:  lives independently  Disposition: Discharge  Co morbidities that complicate the patient evaluation  Past Medical History:  Diagnosis Date   Asthma    Kidney infection    UTI (lower urinary tract infection)  Medicines No orders of the defined types were placed in this encounter.   I have reviewed the patients home medicines and have made adjustments as needed  Problem List / ED Course: Problem List Items Addressed This Visit   None Visit Diagnoses     Iron deficiency anemia due to chronic blood loss    -  Primary                   Final Clinical Impression(s) / ED Diagnoses Final diagnoses:  Iron deficiency anemia due to chronic blood loss    Rx / DC Orders ED Discharge Orders     None         Shon Baton, MD 12/14/22 [phone]

## 2023-01-13 NOTE — Progress Notes (Signed)
 Subjective   Annual Physical  Exam  Alicia [name] is a 42 y.o. (DOB 1981/01/08) female.     Patient presents with   Annual Exam    Patient presents today for annual exam. No concerns at this time. Patient would like STD screening. No new partners or symptoms.   Gaps In Care    Influenza vaccine// patient declines at this time.     Patient presents for her annual physical exam today.  She reports compliance with her iron supplement. She has no questions or concerns at this time.  She is requesting routine STD screening.  She is up-to-date on her mammogram.  Declines flu vaccine today.  She states she stopped taking the sertraline  within the first couple weeks of starting it because it upset her stomach.  She does not want to pursue any other medication at this time for her anxiety and depression.  She currently sees a therapist once weekly and would prefer to stick with this for now.  She denies SI, thoughts of hurting others, or thoughts of hurting herself.  Health Maintenance/Screening:  Health Maintenance  Topic Date Due   Influenza Vaccine (1) Never done   COVID-19 Vaccine (3 - 2024-25 season) 12/21/2022   Mammogram  07/22/2023   Pap Smear  01/29/2025   Adult Wellness Exam  01/12/2026   DTaP/Tdap/Td Vaccines (3 - Td or Tdap) 06/07/2031   Zoster Vaccine (1 of 2) 04/19/2032   Pneumococcal Vaccine: Pediatrics and At Risk Patients  Aged Out   Hepatitis A Vaccine  Aged Out   Meningococcal Conjugate Vaccine  Aged Out   HPV Vaccine  Aged Out    GC/Chl Screen :(Age <26 if sexually active, annually): History of GC/Chl: no Desires STD Screening: yes  Immunizations Immunization History  Administered Date(s) Administered   Pfizer-BioNTech COVID-19 Monovalent Vaccine mRNA 12/08/2019, 01/10/2020   Tdap 12/29/2008, 06/06/2021   Gardasil Series Complete: no  Lifestyle:  Body mass index is 24.84 kg/m. Diet:  general Exercise frequency:   occasionally Exercise type:  no regular exercise  Gynecologic Health: [id number] Regular menses: yes Menses flow excessively heavy: yes Need for birth control: no  Patient Active Problem List  Diagnosis   Dependent edema   GAD (generalized anxiety disorder)   Fibroadenoma of left breast   Moderate episode of recurrent major depressive disorder (*)   Iron deficiency anemia   Menorrhagia with regular cycle    Allergies Patient has No Known Allergies.  Past Medical History Patient  has a past medical history of Asthma.  Past Surgical History Patient  has a past surgical history that includes Wisdom tooth extraction.  Social History Patient  reports that she has never smoked. She has never been exposed to tobacco smoke. She has never used smokeless tobacco. She reports current alcohol use of about 2.0 standard drinks of alcohol per week. She reports that she does not use drugs.  Family History Patient's family history includes Arthritis in her mother; Hypertension in her maternal grandmother and sister; No Known Problems in her sister.  Review of Systems  Pertinent positives: None Otherwise ROS is negative.    Depression Screening  More data exists      01/13/2023 11/20/2022 06/26/2022  Depression Screen  Please choose the category that best describes the patient's current state 1 1 0  Not eligible on the basis of Not applicable Not applicable Not applicable  1. Little interest or pleasure in doing things 3 2 0  2. Feeling down, depressed, or  hopeless 3 3 0  PHQ-2 Total Score 6 5 0  PHQ-2 Interpretation of Score for Depression (Payor) - - Negative  3. Trouble falling or staying asleep 1 3 -  4. Feeling tired or having little energy 2 3 -  5. Poor appetite or overeating 2 2 -  6. Feeling bad about yourself - or that you are a failure or have let yourself or your family down 3 1 -  7. Trouble concentrating on things, such as reading the newspaper or watching television  1 3 -  8. Moving or speaking so slowly that other people could have noticed.  Or the opposite - being so fidgety or restless that you have been moving around a lot more than usual. 2 3 -  9. Thoughts that you would be better off dead, or of hurting yourself in some way. 0 0 -  PHQ Total Score 17 20 -  Interpretation: Moderately Severe Depression, immediate pharmacotherapy and/or counseling recommended Severe Depression, immediate initiation of treatment and possible referral, assess suicide risk -  PHQ-9 Interpretation of Score for Depression (Payor) Positive Positive -  Type of intervention recommended: Patient prescribed or currently receiving intervention (treatment/counseling/patient engagement app);Patient declines further treatment Patient prescribed or currently receiving intervention (treatment/counseling/patient engagement app) -    GAD7 Review A generalized anxiety disorder was performed with a standardized tool. A follow-up plan was discussed with the patient.  GAD7 Review       01/13/2023 11/20/2022 06/06/2021 12/06/2020  Generalized Anxiety Disorder 7 item (GAD-7)  1. Feeling nervous, anxious, or on the edge 2 3 1 3   2. Not being able to stop or control worrying 3 3 0 2  3. Worrying too much about different things 2 3 0 3  4. Trouble relaxing 1 3 0 2  5. Being so restless that it's hard to sit still 2 2 0 2  6. Being easily annoyed or irritable 1 2 0 3  7. Feeling afraid as if something awful might happen 3 3 0 2  Total Score 14 19 1 17   Interpretation:  Moderate, likely to have an anxiety disorder   Severe, very likely to have an anxiety disorder  Normal  Severe, very likely to have an anxiety disorder   If you checked off any problems, how difficult have these made it for you to do your work, take care of things at home, or get along with other people? - - 0 -    Objective   BP 116/78 (BP Location: Left arm, Patient Position: Sitting)   Pulse 98   Temp 97.6 F (36.4 C)  (Temporal)   Resp 12   Wt 143 lb 9.6 oz (65.1 kg)   SpO2 100%   BMI 24.84 kg/m   General: Alert and Oriented.  No acute distress. HEENT: Normocephalic, atraumatic. Sclera noniecteric, conjunctiva clear Neck:  Supple with no lymphadenopathy, masses, or thyromegaly. Lungs:   The lungs are clear to auscultation bilaterally with no wheezes or crackles Cardio:  RRR, No murmur, rub, or gallops. Abdomen:  Normal bowel sounds. soft and non tender without rebound or guarding,  no masses are noted.  No hepatosplenomegaly is noted.  Skin:  No rashes or worrisome lesions are noted.  Extremities:  No pretibial edema Neuro: Gait normal.  CN 2-11 are grossly normal.  Psych:  Normal mood and affect  Breast Exam:  Deferred Pelvic:   Deferred  A PAP smear was not performed.    GC/Chlamydia testing was performed.  Assessment/Plan   Annual Physical Exam Health maintenance issues including appropriate cancer screening, healthy diet, exercise, and tobacco avoidance/cessation were discussed with the patient.   Routine labs and screening tests ordered.  Consider daily women's multivitamin for adequate folic acid in reproductive aged woman. Contraception counseling performed and patient declines today.  Written patient instructions printed and documented in After Visit Summary  Orders Placed This Encounter  Procedures   CT/NG/TRICH,NAA Urine   CBC And Differential   Comprehensive Metabolic Panel   TSH Rfx on Abnormal to Free T4   Syphilis: RPR With Reflex to RPR Titer and Treponemal Ab   HIV p24 Antigen/Antibody With Reflex to Confirmation   Iron and Total Iron-Binding Capacity (TIBC)   Iron deficiency anemia  Recheck CBC and obtain iron panel today.  Advised patient to continue with iron supplement while awaiting results.  GAD (generalized anxiety disorder)  GAD score as above.  Patient declines medication management at this time.  Continue with weekly therapy sessions.  Moderate  episode of recurrent major depressive disorder (*)  PHQ score as above.  Patient declines medication management.  Continue with weekly therapy sessions.  Advised patient to follow-up if she would like to pursue medication in the future.    Follow up in about 3 months (around 04/14/2023) for IDA pending labs, mental health.   No future appointments.   Medications at end of visit:  Current Outpatient Medications:    ferrous sulfate  325 (65 FE) MG tablet, Take one tablet (325 mg dose) by mouth., Disp: , Rfl:

## 2024-04-26 ENCOUNTER — Observation Stay (HOSPITAL_BASED_OUTPATIENT_CLINIC_OR_DEPARTMENT_OTHER)
Admission: EM | Admit: 2024-04-26 | Discharge: 2024-04-27 | Disposition: A | Discharge status: Home / Self Care | Attending: Family Medicine | Admitting: Family Medicine

## 2024-04-26 ENCOUNTER — Other Ambulatory Visit: Payer: Self-pay

## 2024-04-26 ENCOUNTER — Encounter (HOSPITAL_BASED_OUTPATIENT_CLINIC_OR_DEPARTMENT_OTHER): Payer: Self-pay

## 2024-04-26 ENCOUNTER — Emergency Department (HOSPITAL_BASED_OUTPATIENT_CLINIC_OR_DEPARTMENT_OTHER)

## 2024-04-26 DIAGNOSIS — R1084 Generalized abdominal pain: Secondary | ICD-10-CM | POA: Diagnosis not present

## 2024-04-26 DIAGNOSIS — D500 Iron deficiency anemia secondary to blood loss (chronic): Principal | ICD-10-CM | POA: Insufficient documentation

## 2024-04-26 DIAGNOSIS — F1090 Alcohol use, unspecified, uncomplicated: Secondary | ICD-10-CM | POA: Insufficient documentation

## 2024-04-26 DIAGNOSIS — D649 Anemia, unspecified: Secondary | ICD-10-CM | POA: Diagnosis present

## 2024-04-26 DIAGNOSIS — D259 Leiomyoma of uterus, unspecified: Secondary | ICD-10-CM | POA: Diagnosis not present

## 2024-04-26 DIAGNOSIS — D62 Acute posthemorrhagic anemia: Secondary | ICD-10-CM

## 2024-04-26 DIAGNOSIS — R109 Unspecified abdominal pain: Secondary | ICD-10-CM | POA: Diagnosis present

## 2024-04-26 DIAGNOSIS — R7889 Finding of other specified substances, not normally found in blood: Secondary | ICD-10-CM | POA: Diagnosis not present

## 2024-04-26 DIAGNOSIS — J45909 Unspecified asthma, uncomplicated: Secondary | ICD-10-CM | POA: Diagnosis not present

## 2024-04-26 DIAGNOSIS — N920 Excessive and frequent menstruation with regular cycle: Secondary | ICD-10-CM | POA: Insufficient documentation

## 2024-04-26 DIAGNOSIS — Z743 Need for continuous supervision: Secondary | ICD-10-CM | POA: Diagnosis not present

## 2024-04-26 DIAGNOSIS — I959 Hypotension, unspecified: Secondary | ICD-10-CM | POA: Diagnosis not present

## 2024-04-26 HISTORY — DX: Anemia, unspecified: D64.9

## 2024-04-26 LAB — URINALYSIS, MICROSCOPIC (REFLEX): RBC / HPF: >50 RBC/hpf (ref 0–5)

## 2024-04-26 LAB — COMPREHENSIVE METABOLIC PANEL WITH GFR
ALT: 9 U/L (ref 0–44)
AST: 14 U/L — ABNORMAL LOW (ref 15–41)
Albumin: 4.1 g/dL (ref 3.5–5.0)
Alkaline Phosphatase: 54 U/L (ref 38–126)
Anion gap: 10 (ref 5–15)
BUN: 11 mg/dL (ref 6–20)
CO2: 24 mmol/L (ref 22–32)
Calcium: 8.8 mg/dL — ABNORMAL LOW (ref 8.9–10.3)
Chloride: 104 mmol/L (ref 98–111)
Creatinine, Ser: 0.63 mg/dL (ref 0.44–1.00)
GFR, Estimated: >60 mL/min
Glucose, Bld: 94 mg/dL (ref 70–99)
Potassium: 3.8 mmol/L (ref 3.5–5.1)
Sodium: 138 mmol/L (ref 135–145)
Total Bilirubin: 0.5 mg/dL (ref 0.0–1.2)
Total Protein: 7.5 g/dL (ref 6.5–8.1)

## 2024-04-26 LAB — CBC WITH DIFFERENTIAL/PLATELET
Abs Immature Granulocytes: 0.01 K/uL (ref 0.00–0.07)
Basophils Absolute: 0 K/uL (ref 0.0–0.1)
Basophils Relative: 1 %
Eosinophils Absolute: 0.4 K/uL (ref 0.0–0.5)
Eosinophils Relative: 9 %
HCT: 25.9 % — ABNORMAL LOW (ref 36.0–46.0)
Hemoglobin: 6.8 g/dL — CL (ref 12.0–15.0)
Immature Granulocytes: 0 %
Lymphocytes Relative: 35 %
Lymphs Abs: 1.4 K/uL (ref 0.7–4.0)
MCH: 16.8 pg — ABNORMAL LOW (ref 26.0–34.0)
MCHC: 26.3 g/dL — ABNORMAL LOW (ref 30.0–36.0)
MCV: 64.1 fL — ABNORMAL LOW (ref 80.0–100.0)
Monocytes Absolute: 0.4 K/uL (ref 0.1–1.0)
Monocytes Relative: 10 %
Neutro Abs: 1.8 K/uL (ref 1.7–7.7)
Neutrophils Relative %: 45 %
Platelets: 385 K/uL (ref 150–400)
RBC: 4.04 MIL/uL (ref 3.87–5.11)
RDW: 19.6 % — ABNORMAL HIGH (ref 11.5–15.5)
WBC: 4.1 K/uL (ref 4.0–10.5)
nRBC: 0 % (ref 0.0–0.2)

## 2024-04-26 LAB — URINALYSIS, ROUTINE W REFLEX MICROSCOPIC
Glucose, UA: NEGATIVE mg/dL
Ketones, ur: 15 mg/dL — AB
Nitrite: NEGATIVE
Protein, ur: >=300 mg/dL — AB
Specific Gravity, Urine: <=1.005 (ref 1.005–1.030)
pH: 5.5 (ref 5.0–8.0)

## 2024-04-26 LAB — ABO/RH: ABO/RH(D): A POS

## 2024-04-26 LAB — LIPASE, BLOOD: Lipase: 26 U/L (ref 11–51)

## 2024-04-26 LAB — PREPARE RBC (CROSSMATCH)

## 2024-04-26 MED ORDER — ONDANSETRON HCL 4 MG/2ML IJ SOLN: 4.0000 mg | Freq: Four times a day (QID) | INTRAMUSCULAR | Status: DC | PRN

## 2024-04-26 MED ORDER — SODIUM CHLORIDE 0.9% IV SOLUTION: Freq: Once | INTRAVENOUS | Status: AC

## 2024-04-26 MED ORDER — SERTRALINE HCL 25 MG PO TABS
25.0000 mg | ORAL_TABLET | Freq: Every day | ORAL | Status: DC
  Filled 2024-04-26: qty 1

## 2024-04-26 MED ORDER — SENNOSIDES-DOCUSATE SODIUM 8.6-50 MG PO TABS: 1.0000 | ORAL_TABLET | Freq: Every evening | ORAL | Status: DC | PRN

## 2024-04-26 MED ORDER — FERROUS SULFATE 325 (65 FE) MG PO TABS
325.0000 mg | ORAL_TABLET | Freq: Three times a day (TID) | ORAL | Status: DC
  Administered 2024-04-26 – 2024-04-27 (×3): 325 mg via ORAL
  Filled 2024-04-26 (×3): qty 1

## 2024-04-26 MED ORDER — ONDANSETRON HCL 4 MG PO TABS: 4.0000 mg | ORAL_TABLET | Freq: Four times a day (QID) | ORAL | Status: DC | PRN

## 2024-04-26 MED ORDER — MEGESTROL ACETATE 40 MG PO TABS
40.0000 mg | ORAL_TABLET | Freq: Every day | ORAL | Status: DC
  Administered 2024-04-26: 40 mg via ORAL
  Filled 2024-04-26 (×2): qty 1

## 2024-04-26 MED ORDER — ACETAMINOPHEN 650 MG RE SUPP: 650.0000 mg | Freq: Four times a day (QID) | RECTAL | Status: DC | PRN

## 2024-04-26 MED ORDER — BISACODYL 5 MG PO TBEC: 5.0000 mg | DELAYED_RELEASE_TABLET | Freq: Every day | ORAL | Status: DC | PRN

## 2024-04-26 MED ORDER — ACETAMINOPHEN 325 MG PO TABS
650.0000 mg | ORAL_TABLET | Freq: Four times a day (QID) | ORAL | Status: DC | PRN
  Administered 2024-04-26 – 2024-04-27 (×2): 650 mg via ORAL
  Filled 2024-04-26 (×2): qty 2

## 2024-04-26 NOTE — Progress Notes (Signed)
"  75F with chronic anemia in the setting of heavy menses initially presented with RUQ abdominal pain found to have Hb <7 in the setting of heavier than usual menses.   Accepted obs at [facility] for blood transfusion, RUQ and pelvic US  are pending. "

## 2024-04-26 NOTE — H&P (Signed)
 " History and Physical  Alicia [name] FMW:[id number] DOB: 01-10-1981 DOA: 04/26/2024  PCP: [facility]   Chief Complaint: Abdominal pain, heavy menstrual bleeding  HPI: Alicia [name] is a 44 y.o. female with medical history significant for asthma, anemia, IDA and menorrhagia who presents to the [facility] ED for evaluation of abdominal pain and heavy menstrual bleeding. Patient reports she has had heavy menstrual cycles since last year and was prescribed iron supplementation but did not get any refills after the initial prescription was completed. Over the last year, she has had progressive fatigue with exertion. On Sunday, she started her menstrual cycle and had mild abdominal cramping. Yesterday she had 2 episodes of sharp right sided abdominal pain and this morning, she started having some dizziness and nausea so she presented to the ED for further evaluation. She endorsed heavy menstrual bleeding over the last 2 days but denies any fevers, chills, dysuria, shortness of breath, headache or chest pain.  ED Course: Initial vitals show patient afebrile and normotensive. Initial labs significant for Hgb 6.8 otherwise normal renal function and white count, UA shows large hemoglobinuria, mild ketonuria, negative nitrite, large leuks, but too pigmented for further analysis. RUQ U/S with no gallstones or biliary dilatation.  Transvaginal ultrasound shows large heterogeneous uterine fibroid. Pt was started on iron supplementation.  OB/GYN was consulted for evaluation and recommended initiating Megace . Patient was admitted to [facility] service and transferred to [facility] for blood transfusion.  Review of Systems: Please see HPI for pertinent positives and negatives. A complete 10 system review of systems are otherwise negative.  Past Medical History:  Diagnosis Date   Anemia    Asthma    Kidney infection    UTI (lower urinary tract infection)    History reviewed. No pertinent  surgical history. Social History:  reports that she has never smoked. She has never used smokeless tobacco. She reports current alcohol use. She reports that she does not use drugs.  Allergies[1]  Family History  Problem Relation Age of Onset   Arthritis Mother    High blood pressure Maternal Grandmother      Prior to Admission medications  Medication Sig Start Date End Date Taking? Authorizing Provider  ferrous sulfate  325 (65 FE) MG tablet Take 1 tablet (325 mg total) by mouth 3 (three) times daily with meals. 12/06/22   Francesca Elsie CROME, MD  ibuprofen  (ADVIL ) 800 MG tablet Take 1 tablet (800 mg total) by mouth every 8 (eight) hours as needed for moderate pain. 11/22/20   Long, Joshua G, MD  sertraline  (ZOLOFT ) 25 MG tablet Take 25 mg by mouth daily. 11/20/22   [provider]    Physical Exam: BP (!) 97/51 (BP Location: Left Arm)   Pulse 74   Temp (P) 98.3 F (36.8 C) (Oral)   Resp 17   Ht 5' 5 (1.651 m)   Wt 67.1 kg   LMP 04/24/2024 (Exact Date)   SpO2 100%   BMI 24.63 kg/m  General: Pleasant, well-appearing middle-age woman laying in bed. No acute distress. HEENT: Alicia [name]/AT. Anicteric sclera. Pale conjunctiva. CV: RRR. No murmurs, rubs, or gallops. No LE edema Pulmonary: Lungs CTAB. Normal effort. No wheezing or rales. Abdominal: Soft, nondistended. Mild RLQ tenderness. Normal bowel sounds. Extremities: Palpable radial and DP pulses. Normal ROM. Skin: Warm and dry. No obvious rash or lesions. Neuro: A&Ox3. Moves all extremities. Normal sensation to light touch. No focal deficit. Psych: Normal mood and affect  Labs on Admission:  Basic Metabolic Panel: Recent Labs  Lab 04/26/24 0940  NA 138  K 3.8  CL 104  CO2 24  GLUCOSE 94  BUN 11  CREATININE 0.63  CALCIUM 8.8*   Liver Function Tests: Recent Labs  Lab 04/26/24 0940  AST 14*  ALT 9  ALKPHOS 54  BILITOT 0.5  PROT 7.5  ALBUMIN 4.1   Recent Labs  Lab 04/26/24 0940  LIPASE 26   No  results for input(s): AMMONIA in the last 168 hours. CBC: Recent Labs  Lab 04/26/24 0940  WBC 4.1  NEUTROABS 1.8  HGB 6.8*  HCT 25.9*  MCV 64.1*  PLT 385   Cardiac Enzymes: No results for input(s): CKTOTAL, CKMB, CKMBINDEX, TROPONINI in the last 168 hours. BNP (last 3 results) No results for input(s): BNP in the last 8760 hours.  ProBNP (last 3 results) No results for input(s): PROBNP in the last 8760 hours.  CBG: No results for input(s): GLUCAP in the last 168 hours.  Radiological Exams on Admission: US  PELVIC COMPLETE WITH TRANSVAGINAL Result Date: 04/26/2024 CLINICAL DATA:  Heavy vaginal bleeding. EXAM: TRANSABDOMINAL AND TRANSVAGINAL ULTRASOUND OF PELVIS TECHNIQUE: Both transabdominal and transvaginal ultrasound examinations of the pelvis were performed. Transabdominal technique was performed for global imaging of the pelvis including uterus, ovaries, adnexal regions, and pelvic cul-de-sac. It was necessary to proceed with endovaginal exam following the transabdominal exam to visualize the uterus, endometrium, bilateral ovaries and bilateral adnexa. COMPARISON:  None Available. FINDINGS: Uterus Measurements: 9.1 cm x 7.6 cm x 8.6 cm = volume: 309 mL. A 6.7 cm x 5.7 cm x 6.2 cm heterogeneous uterine fibroid is seen within the posterior aspect of the uterus, abutting the endometrium. Endometrium Thickness: 4.0 mm. No focal abnormality visualized (limited in visualization). Right ovary Measurements: 2.7 cm x 2.0 cm x 2.5 cm = volume: 7.1 mL. Normal appearance/no adnexal mass. Left ovary Measurements: 2.6 cm x 2.3 cm x 2.0 cm = volume: 6.4 mL. Normal appearance/no adnexal mass. Other findings No abnormal free fluid. IMPRESSION: Large heterogeneous uterine fibroid. Electronically Signed   By: Suzen Dials M.D.   On: 04/26/2024 12:04   US  Abdomen Limited RUQ (LIVER/GB) Result Date: 04/26/2024 CLINICAL DATA:  Right upper quadrant abdominal pain. EXAM: ULTRASOUND ABDOMEN  LIMITED RIGHT UPPER QUADRANT COMPARISON:  None Available. FINDINGS: Gallbladder: No gallstones or wall thickening visualized. At least 3 separate small gallbladder polyps which appear nonmobile with the largest measuring approximately 6 mm in maximum diameter. No sonographic Murphy sign noted by sonographer. Common bile duct: Diameter: 4 mm. Liver: No focal lesion identified. Within normal limits in parenchymal echogenicity. Portal vein is patent on color Doppler imaging with normal direction of blood flow towards the liver. Other: No ascites in the right upper quadrant. IMPRESSION: 1. No evidence of gallstones or biliary dilatation. 2. At least 3 separate small gallbladder polyps with the largest measuring 6 mm in maximum diameter. No follow-up imaging is required for gallbladder polyps of this size. Electronically Signed   By: Marcey Moan M.D.   On: 04/26/2024 10:38   Assessment/Plan Alicia [name] is a 44 y.o. female with medical history significant for asthma, anemia, IDA and menorrhagia who presents to the [facility] ED for evaluation of abdominal pain and heavy menstrual bleeding and admitted for symptomatic anemia.  # Acute blood loss anemia # Acute on chronic anemia # Symptomatic anemia - Patient presented with progressive fatigue, nausea and dizziness - Found to have drop in Hgb to 6.8 from  7.6 1-year ago but baseline seems to be around 12-13 - Presentation secondary to acute on chronic blood loss anemia from menorrhagia - Transfused 2 units PRBC and follow-up posttransfusion H&H - Trend CBC and transfuse for hgb > 7  # Menorrhagia # Abnormal uterine bleeding # Uterine fibroid - Patient reports history of heavy menstrual bleeding since last year - Transvaginal ultrasound shows a large heterogenous uterine fibroid, likely the cause of her AUB - OB/GYN consulted by EDP, appreciate further recs - Continue Megace   # IDA - Follow-up iron studies, ferritin and vitamin B12 -  Continue iron supplementation  # Asthma - Reports this was a childhood diagnosis, no asthma since adulthood   DVT prophylaxis: SCDs    Code Status: Full Code  Consults called: OB/GYN  Family Communication: Discussed results/findings and plan for admission with mother at bedside  Severity of Illness: The appropriate patient status for this patient is OBSERVATION. Observation status is judged to be reasonable and necessary in order to provide the required intensity of service to ensure the patient's safety. The patient's presenting symptoms, physical exam findings, and initial radiographic and laboratory data in the context of their medical condition is felt to place them at decreased risk for further clinical deterioration. Furthermore, it is anticipated that the patient will be medically stable for discharge from the hospital within 2 midnights of admission.   Level of care: Med-Surg    Lou Claretta HERO, MD 04/26/2024, 8:50 PM [facility] Pager: [phone] Isaiah 41:10   If 7PM-7AM, please contact night-coverage [email or web address] Password [email or web address]     [1] No Known Allergies  "

## 2024-04-26 NOTE — ED Notes (Signed)
 Patient departed [facility] ED with [facility] at this time.

## 2024-04-26 NOTE — ED Notes (Signed)
 Pt in US , will obtain bloodwork when she returns

## 2024-04-26 NOTE — ED Provider Notes (Signed)
 " [facility] Provider Note   CSN: [id number] Arrival date & time: 04/26/24  9141     Patient presents with: Abdominal Pain   Timmy Cleverly is a 44 y.o. female who presents to the ED today with a 1 day history of right upper abdominal discomfort.  She states it feels like a pulling sensation that began when she was on the toilet yesterday.  Exacerbated with right sided rotation of the torso and lasted for less than 2 minutes.  She had another episode later in the day which was similar, exacerbated with lateral rotation of the torso.  Endorses worsening of pain with oral intake, has had some intermittent nausea but states that she is currently menstruating of this is normal, has not had any episodes of vomiting.  No changes in her bowel movements, has had normal bowel movements as of yesterday evening.  No dysuria.  Stated that menstruation began last 3 days ago, has had heavier than normal flow otherwise no complications thus far.  Not on any OCP.  Denies any previous medical history, has not had any previous abdominal surgeries.    Abdominal Pain      Prior to Admission medications  Medication Sig Start Date End Date Taking? Authorizing Provider  ferrous sulfate  325 (65 FE) MG tablet Take 1 tablet (325 mg total) by mouth 3 (three) times daily with meals. 12/06/22   Francesca Elsie CROME, MD  ibuprofen  (ADVIL ) 800 MG tablet Take 1 tablet (800 mg total) by mouth every 8 (eight) hours as needed for moderate pain. 11/22/20   Long, Joshua G, MD  sertraline  (ZOLOFT ) 25 MG tablet Take 25 mg by mouth daily. 11/20/22   [provider]    Allergies: Patient has no known allergies.    Review of Systems  Gastrointestinal:  Positive for abdominal pain.  All other systems reviewed and are negative.   Updated Vital Signs BP 112/77   Pulse 68   Temp 98.1 F (36.7 C) (Oral)   Resp 16   Ht 5' 5 (1.651 m)   Wt 67.1 kg   LMP 04/24/2024 (Exact  Date)   SpO2 100%   BMI 24.63 kg/m   Physical Exam Vitals and nursing note reviewed.  Constitutional:      General: She is not in acute distress.    Appearance: Normal appearance.  HENT:     Head: Normocephalic and atraumatic.     Mouth/Throat:     Mouth: Mucous membranes are moist.     Pharynx: Oropharynx is clear.  Eyes:     Extraocular Movements: Extraocular movements intact.     Conjunctiva/sclera: Conjunctivae normal.     Pupils: Pupils are equal, round, and reactive to light.  Cardiovascular:     Rate and Rhythm: Normal rate and regular rhythm.     Pulses: Normal pulses.     Heart sounds: Normal heart sounds. No murmur heard.    No friction rub. No gallop.  Pulmonary:     Effort: Pulmonary effort is normal.     Breath sounds: Normal breath sounds.  Abdominal:     General: Abdomen is flat. Bowel sounds are normal.     Palpations: Abdomen is soft.     Tenderness: There is abdominal tenderness in the epigastric area. There is no right CVA tenderness or left CVA tenderness. Negative signs include Murphy's sign.  Musculoskeletal:        General: Normal range of motion.  Cervical back: Normal range of motion and neck supple.     Right lower leg: No edema.     Left lower leg: No edema.  Skin:    General: Skin is warm and dry.     Capillary Refill: Capillary refill takes less than 2 seconds.  Neurological:     General: No focal deficit present.     Mental Status: She is alert. Mental status is at baseline.  Psychiatric:        Mood and Affect: Mood normal.     (all labs ordered are listed, but only abnormal results are displayed) Labs Reviewed  CBC WITH DIFFERENTIAL/PLATELET - Abnormal; Notable for the following components:      Result Value   Hemoglobin 6.8 (*)    HCT 25.9 (*)    MCV 64.1 (*)    MCH 16.8 (*)    MCHC 26.3 (*)    RDW 19.6 (*)    All other components within normal limits  COMPREHENSIVE METABOLIC PANEL WITH GFR - Abnormal; Notable for the  following components:   Calcium 8.8 (*)    AST 14 (*)    All other components within normal limits  LIPASE, BLOOD  URINALYSIS, ROUTINE W REFLEX MICROSCOPIC  ABO/RH    EKG: None  Radiology: US  Abdomen Limited RUQ (LIVER/GB) Result Date: 04/26/2024 CLINICAL DATA:  Right upper quadrant abdominal pain. EXAM: ULTRASOUND ABDOMEN LIMITED RIGHT UPPER QUADRANT COMPARISON:  None Available. FINDINGS: Gallbladder: No gallstones or wall thickening visualized. At least 3 separate small gallbladder polyps which appear nonmobile with the largest measuring approximately 6 mm in maximum diameter. No sonographic Murphy sign noted by sonographer. Common bile duct: Diameter: 4 mm. Liver: No focal lesion identified. Within normal limits in parenchymal echogenicity. Portal vein is patent on color Doppler imaging with normal direction of blood flow towards the liver. Other: No ascites in the right upper quadrant. IMPRESSION: 1. No evidence of gallstones or biliary dilatation. 2. At least 3 separate small gallbladder polyps with the largest measuring 6 mm in maximum diameter. No follow-up imaging is required for gallbladder polyps of this size. Electronically Signed   By: Marcey Moan M.D.   On: 04/26/2024 10:38     Procedures   Medications Ordered in the ED  megestrol  (MEGACE ) tablet 40 mg (40 mg Oral Given 04/26/24 1123)                                    Medical Decision Making Amount and/or Complexity of Data Reviewed Labs: ordered. Radiology: ordered.  Risk Prescription drug management. Decision regarding hospitalization.   Medical Decision Making:   Amarea Macdowell is a 44 y.o. female who presented to the ED today with right-sided back pain and upper abdominal pain detailed above.    External chart has been reviewed including previous labs, imaging, primary care records. Patient placed on continuous vitals and telemetry monitoring while in ED which was reviewed periodically.  Complete  initial physical exam performed, notably the patient  was alert and oriented in no apparent distress.  She does have some epigastric tenderness otherwise negative Murphy sign, and no other abdominal tenderness.    Reviewed and confirmed nursing documentation for past medical history, family history, social history.    Initial Assessment:   With the patient's presentation of right sided upper back pain and right upper abdominal pain, differential includes potential hepatobiliary disease, pancreatitis, gastritis, muscular strain, bowel obstruction,  fecal impaction.  Initial Plan:  Obtain ultrasound of the right upper quadrant to assess for hepatobiliary obstruction Screening labs including CBC and Metabolic panel to evaluate for infectious or metabolic etiology of disease.  Urinalysis with reflex culture ordered to evaluate for UTI or relevant urologic/nephrologic pathology.  Objective evaluation as below reviewed   Initial Study Results:   Laboratory  All laboratory results reviewed without evidence of clinically relevant pathology.   Exceptions include: Hemoglobin of 6.8   Radiology:  All images reviewed independently. Agree with radiology report at this time.   US  Abdomen Limited RUQ (LIVER/GB) Result Date: 04/26/2024 CLINICAL DATA:  Right upper quadrant abdominal pain. EXAM: ULTRASOUND ABDOMEN LIMITED RIGHT UPPER QUADRANT COMPARISON:  None Available. FINDINGS: Gallbladder: No gallstones or wall thickening visualized. At least 3 separate small gallbladder polyps which appear nonmobile with the largest measuring approximately 6 mm in maximum diameter. No sonographic Murphy sign noted by sonographer. Common bile duct: Diameter: 4 mm. Liver: No focal lesion identified. Within normal limits in parenchymal echogenicity. Portal vein is patent on color Doppler imaging with normal direction of blood flow towards the liver. Other: No ascites in the right upper quadrant. IMPRESSION: 1. No evidence of  gallstones or biliary dilatation. 2. At least 3 separate small gallbladder polyps with the largest measuring 6 mm in maximum diameter. No follow-up imaging is required for gallbladder polyps of this size. Electronically Signed   By: Marcey Moan M.D.   On: 04/26/2024 10:38      Consults: Case discussed with Dr. Zella with hospitalist team.  OB/GYN related to abnormal vaginal bleeding  Reassessment and Plan:   Patient continues to have heavy menstrual flow, and hemoglobin is 6.8.  This is low compared to the patient's baseline and is of concern for acute blood loss anemia requiring transfusion of blood products.  Discussed this with the patient who agrees and consents to receiving blood products and for transfer for same.  Further, given her heavy menstrual flow, obtained ultrasound of the pelvis to assess for gynecologic etiology, further, consulted with OB/GYN considering abnormal uterine bleeding who advised to provide patient with dose of Megace , 40 mg p.o.  At this time plan is to transfer patient to [facility] for transfusion of blood products, reevaluation of her hemoglobin prior to discharge.  Discussed this with Dr. Zella who accepts patient for blood transfusion in light of symptomatic anemia.       Final diagnoses:  Blood loss anemia    ED Discharge Orders     None          Myriam Dorn BROCKS, [place] 04/26/24 1132    Jerrol Agent, MD 04/26/24 1222  "

## 2024-04-26 NOTE — ED Triage Notes (Signed)
 States was on the toilet and twisted yesterday and felt a sharp pain in RUQ. Became dizzy and nauseous while pain was there. States happened again today when moving. Pain mainly when moving, coughing/breathing. Denies vomiting/diarrhea. Currently on menstrual cycle, heavier than usual.

## 2024-04-26 NOTE — ED Notes (Signed)
 Attempted to get urine specimen, pt called this RN to room and there was blood on the floor. Provider notified

## 2024-04-26 NOTE — ED Notes (Signed)
 Report received from Marolyn, [place]. Assuming patient care at this time.

## 2024-04-27 ENCOUNTER — Other Ambulatory Visit (HOSPITAL_COMMUNITY): Payer: Self-pay

## 2024-04-27 DIAGNOSIS — D649 Anemia, unspecified: Secondary | ICD-10-CM

## 2024-04-27 LAB — BPAM RBC
Blood Product Expiration Date: 202602072359
Blood Product Expiration Date: 202602072359
ISSUE DATE / TIME: 202601062054
ISSUE DATE / TIME: 202601062353
Unit Type and Rh: 6200
Unit Type and Rh: 6200

## 2024-04-27 LAB — CBC
HCT: 32.5 % — ABNORMAL LOW (ref 36.0–46.0)
Hemoglobin: 9.8 g/dL — ABNORMAL LOW (ref 12.0–15.0)
MCH: 21.3 pg — ABNORMAL LOW (ref 26.0–34.0)
MCHC: 30.2 g/dL (ref 30.0–36.0)
MCV: 70.7 fL — ABNORMAL LOW (ref 80.0–100.0)
Platelets: 303 K/uL (ref 150–400)
RBC: 4.6 MIL/uL (ref 3.87–5.11)
RDW: 22.5 % — ABNORMAL HIGH (ref 11.5–15.5)
WBC: 5.8 K/uL (ref 4.0–10.5)
nRBC: 0 % (ref 0.0–0.2)

## 2024-04-27 LAB — TYPE AND SCREEN
ABO/RH(D): A POS
Antibody Screen: NEGATIVE
Unit division: 0
Unit division: 0

## 2024-04-27 LAB — BASIC METABOLIC PANEL WITH GFR
Anion gap: 11 (ref 5–15)
BUN: 9 mg/dL (ref 6–20)
CO2: 22 mmol/L (ref 22–32)
Calcium: 9 mg/dL (ref 8.9–10.3)
Chloride: 105 mmol/L (ref 98–111)
Creatinine, Ser: 0.67 mg/dL (ref 0.44–1.00)
GFR, Estimated: >60 mL/min
Glucose, Bld: 95 mg/dL (ref 70–99)
Potassium: 3.8 mmol/L (ref 3.5–5.1)
Sodium: 137 mmol/L (ref 135–145)

## 2024-04-27 LAB — FERRITIN: Ferritin: 11 ng/mL (ref 11–307)

## 2024-04-27 LAB — HIV ANTIBODY (ROUTINE TESTING W REFLEX): HIV Screen 4th Generation wRfx: NONREACTIVE

## 2024-04-27 LAB — IRON AND TIBC
Iron: 103 ug/dL (ref 28–170)
Saturation Ratios: 22 % (ref 10.4–31.8)
TIBC: 473 ug/dL — ABNORMAL HIGH (ref 250–450)
UIBC: 370 ug/dL

## 2024-04-27 LAB — VITAMIN B12: Vitamin B-12: 440 pg/mL (ref 180–914)

## 2024-04-27 MED ORDER — ALUM & MAG HYDROXIDE-SIMETH 200-200-20 MG/5ML PO SUSP
30.0000 mL | ORAL | Status: DC | PRN
  Administered 2024-04-27: 30 mL via ORAL
  Filled 2024-04-27: qty 30

## 2024-04-27 MED ORDER — FERROUS SULFATE 325 (65 FE) MG PO TABS
325.0000 mg | ORAL_TABLET | ORAL | 0 refills | Status: AC
  Filled 2024-04-27: qty 45, 90d supply, fill #0

## 2024-04-27 MED ORDER — MEGESTROL ACETATE 40 MG PO TABS
ORAL_TABLET | ORAL | 0 refills | Status: AC
  Filled 2024-04-27: qty 39, 24d supply, fill #0

## 2024-04-27 MED ORDER — POLYETHYLENE GLYCOL 3350 17 GM/SCOOP PO POWD
17.0000 g | Freq: Every day | ORAL | 0 refills | Status: AC
  Filled 2024-04-27: qty 238, 14d supply, fill #0

## 2024-04-27 NOTE — Progress Notes (Signed)
 Discharge medications delivered to patient at the bedside in a secure bag.

## 2024-04-27 NOTE — Plan of Care (Signed)
  Problem: Education: Goal: Knowledge of General Education information will improve Description: Including pain rating scale, medication(s)/side effects and non-pharmacologic comfort measures Outcome: Progressing   Problem: Clinical Measurements: Goal: Ability to maintain clinical measurements within normal limits will improve Outcome: Progressing Goal: Cardiovascular complication will be avoided Outcome: Progressing   Problem: Pain Managment: Goal: General experience of comfort will improve and/or be controlled Outcome: Progressing   Problem: Safety: Goal: Ability to remain free from injury will improve Outcome: Progressing

## 2024-04-27 NOTE — Progress Notes (Addendum)
 Brief Hospital Course: 44 y/o female with a history of anemia presents with lower abdominal pain and heavy menstrual bleeding. Given 2 units of blood and responding well. Transvaginal ultrasound revealed uterine fibroids.   Subjective: Weakness, dizziness, fatigue, headache has subsided, heavier bleeding that usual for day 3 of period Objective: Vital signs: BP 115/71 (BP Location: Left Arm)   Pulse 60   Temp 98.6 F (37 C) (Oral)   Resp 18   Ht 5' 5 (1.651 m)   Wt 67.1 kg   LMP 04/24/2024 (Exact Date)   SpO2 100%   BMI 24.63 kg/m   Adult female sitting up in bed, does not appear to be in any acute distress No scleral icterus or cyanosis RRR, no murmurs, no LE edema Moving air well, lung sounds CTAB Mild lower abdomen TTP, normoactive bowel sounds   Assessment and Plan: Anemia -follow up with PCP to assess CBC -begin taking 80mg  iron supplement PO every other day (M, W, F, Sun) -begin taking miralax  with iron supplements to prevent constipation  Uterine fibroids -follow up with OBGYN    Marcelline Greet, PA-S [facility]  I have seen the patient and discussed with the PA student.  The above note has been edited to reflect our agreed on history and exam.  For details of my assessment and actual orders placed by me, the only accurate record is my separate documentation.   Lonni SQUIBB Azayla Polo  5:26 PM 04/27/2024

## 2024-04-27 NOTE — Progress Notes (Signed)
" °   04/27/24 0847  TOC Brief Assessment  Insurance and Status Reviewed  Patient has primary care physician Yes ([facility]  Home environment has been reviewed from home  Prior level of function: independent  Prior/Current Home Services No current home services  Social Drivers of Health Review SDOH reviewed no interventions necessary  Readmission risk has been reviewed Yes  Transition of care needs no transition of care needs at this time    "

## 2024-04-27 NOTE — Progress Notes (Signed)
 Patient refused Megace . This RN educated patient on the importance and use of the medication. She stated she will take from here on out.

## 2024-04-27 NOTE — Discharge Summary (Signed)
 " Physician Discharge Summary   Patient: Alicia [name] MRN: [id number] DOB: [birth year]  Admit date:     04/26/2024  Discharge date: 04/27/2024  Discharge Physician: Lonni SHAUNNA Dalton   PCP: [facility]     Recommendations at discharge:  Follow up with OB-Gyn for menorrhagia, fibroids, referral sent Complete Megace  taper per OB-Gyn Follow up with PCP  PCP: Check CBC in 1 week and iron levels in 6-8 weeks     Discharge Diagnoses: Principal Problem:   Symptomatic anemia Active Problems:   Menorrhagia with regular cycle   Uterine leiomyoma   Iron deficiency anemia due to chronic blood loss   Acute on chronic blood loss anemia      Hospital Course: 44 y.o. F with hx IDA and menorrhagia presented with abdominal cramping and fagitue on exertion  In the ER, Hgb 6.8 g/dL, admitted for transfusion.     Menorrhagia Iron deficiency anemia due to chronic fibroid bleeding Fibroids Iron studies markedly low.  Transfused and symptoms of anemia resolved and Hgb up to 9.8 g/dL.  Case discussed with OB-Gyn.  Started on Megace .  OB-Gyn recommended taper: Megace  120 mg daily for 5 days then 80 mg daily for 5 days then 40 mg daily until Gyn follow up  Referral sent internally, she will also call her prior clinic at [facility].  Started on oral iron at discharge.            The [place]  Controlled Substances Registry was reviewed for this patient prior to discharge.  Consultants: OB-Gyn by phone Procedures performed:  TVUS  Disposition: Home Diet recommendation:  Regular diet  DISCHARGE MEDICATION: Allergies as of 04/27/2024   No Known Allergies      Medication List     TAKE these medications    acetaminophen  500 MG tablet Commonly known as: TYLENOL  Take 500 mg by mouth daily as needed for mild pain (pain score 1-3) or moderate pain (pain score 4-6).   FeroSul 325 (65 Fe) MG tablet Generic drug: ferrous sulfate  Take 1 tablet (325 mg  total) by mouth every other day. What changed: when to take this   megestrol  40 MG tablet Commonly known as: MEGACE  Take 3 tablets (120 mg total) by mouth daily for 5 days, THEN 2 tablets (80 mg total) daily for 5 days, THEN 1 tablet (40 mg total) daily for 14 days. Start taking on: April 28, 2024   polyethylene glycol powder 17 GM/SCOOP powder Commonly known as: MiraLax  Take 17 g by mouth daily. Dissolve 1 capful (17g) in 4-8 ounces of liquid and take by mouth daily.        Follow-up Information     [facility]. Call.   Specialty: Obstetrics and Gynecology Why: Please call to schedule a follow up appointment Contact information: [address] [place]  [address] [phone]        [facility] Schedule an appointment as soon as possible for a visit in 1 week(s).   Contact information: [address] [place] [address] [phone]                 Discharge Instructions     Discharge instructions   Complete by: As directed    **IMPORTANT DISCHARGE INSTRUCTIONS**   From Dr. Dalton: You were admitted for anemia  Here, we found that you had slow blood loss anemia from menstrual bleeding.  Your imaging shows a fibroid, which  may be the cause.  We discussed with our OB=Gyn team and they recommended Megace  to suppress bleeding  They recommend a taper:  Take megace  120 mg (three tabs) daily for 5 days then 80 mg (2 tabs) daily for 5 days then 40 mg daily  When you take the 120 mg dose you can take all three tabs at the same time in the morning Same for the 80 mg dose (both tabs at the same time in the morning)  Take megace  40 mg until you see OB Gyn  Our OB-Gyn has sent a message to [facility] in [place] to see you there as soon as possible (see below in the To Do section, [facility] [place] [facility] on [address])  If you want to go back to the [place] clinic you saw last in 2018, you may call them: [facility]  [phone] [address] [place], [address] [place], [place] [address]  You last saw Delon Loyal in 2018 Go see whichever clinic can get you in sooner  For the anemia: Take ferrous sulfate  to replace iron Take ferrous sulfate  325 mg every other day Take with MiraLAX  to prevent constipation  Have your primary care doctor check your blood level in 1-2 weeks and check your iron levels in 6-8 weeks   Increase activity slowly   Complete by: As directed        Discharge Exam: Filed Weights   04/26/24 0914  Weight: 67.1 kg    General: Pt is alert, awake, not in acute distress Cardiovascular: RRR, nl S1-S2, no murmurs appreciated.   No LE edema.   Respiratory: Normal respiratory rate and rhythm.  CTAB without rales or wheezes. Abdominal: Abdomen soft and non-tender.  No distension or HSM.   Neuro/Psych: Strength symmetric in upper and lower extremities.  Judgment and insight appear normal.   Condition at discharge: good  The results of significant diagnostics from this hospitalization (including imaging, microbiology, ancillary and laboratory) are listed below for reference.   Imaging Studies: US  PELVIC COMPLETE WITH TRANSVAGINAL Result Date: 04/26/2024 CLINICAL DATA:  Heavy vaginal bleeding. EXAM: TRANSABDOMINAL AND TRANSVAGINAL ULTRASOUND OF PELVIS TECHNIQUE: Both transabdominal and transvaginal ultrasound examinations of the pelvis were performed. Transabdominal technique was performed for global imaging of the pelvis including uterus, ovaries, adnexal regions, and pelvic cul-de-sac. It was necessary to proceed with endovaginal exam following the transabdominal exam to visualize the uterus, endometrium, bilateral ovaries and bilateral adnexa. COMPARISON:  None Available. FINDINGS: Uterus Measurements: 9.1 cm x 7.6 cm x 8.6 cm = volume: 309 mL. A 6.7  cm x 5.7 cm x 6.2 cm heterogeneous uterine fibroid is seen within the posterior aspect of the uterus, abutting the endometrium. Endometrium Thickness: 4.0 mm. No focal abnormality visualized (limited in visualization). Right ovary Measurements: 2.7 cm x 2.0 cm x 2.5 cm = volume: 7.1 mL. Normal appearance/no adnexal mass. Left ovary Measurements: 2.6 cm x 2.3 cm x 2.0 cm = volume: 6.4 mL. Normal appearance/no adnexal mass. Other findings No abnormal free fluid. IMPRESSION: Large heterogeneous uterine fibroid. Electronically Signed   By: Suzen Dials M.D.   On: 04/26/2024 12:04   US  Abdomen Limited RUQ (LIVER/GB) Result Date: 04/26/2024 CLINICAL DATA:  Right upper quadrant abdominal pain. EXAM: ULTRASOUND ABDOMEN LIMITED RIGHT UPPER QUADRANT COMPARISON:  None Available. FINDINGS: Gallbladder: No gallstones or wall thickening visualized. At least 3 separate small gallbladder polyps which appear nonmobile with the largest measuring approximately 6 mm  in maximum diameter. No sonographic Murphy sign noted by sonographer. Common bile duct: Diameter: 4 mm. Liver: No focal lesion identified. Within normal limits in parenchymal echogenicity. Portal vein is patent on color Doppler imaging with normal direction of blood flow towards the liver. Other: No ascites in the right upper quadrant. IMPRESSION: 1. No evidence of gallstones or biliary dilatation. 2. At least 3 separate small gallbladder polyps with the largest measuring 6 mm in maximum diameter. No follow-up imaging is required for gallbladder polyps of this size. Electronically Signed   By: Marcey Moan M.D.   On: 04/26/2024 10:38    Microbiology: Results for orders placed or performed in visit on 12/29/12  Urine Culture     Status: None   Collection Time: 12/30/12  9:17 AM   Specimen: Urine  Result Value Ref Range Status   Culture ESCHERICHIA COLI  Final   Colony Count >=100,000 COLONIES/ML  Final   Organism ID, Bacteria ESCHERICHIA COLI  Final       Susceptibility   Escherichia coli -  (no method available)    AMPICILLIN 4 Sensitive     AMPICILLIN/SULBACTAM <=2 Sensitive     PIP/TAZO <=4 Sensitive     IMIPENEM 0.5 Sensitive     CEFAZOLIN <=4 Sensitive     CEFOXITIN <=4 Sensitive     CEFTRIAXONE <=1 Sensitive     CEFTAZIDIME <=1 Sensitive     CEFEPIME <=1 Sensitive     GENTAMICIN <=1 Sensitive     TOBRAMYCIN <=1 Sensitive     CIPROFLOXACIN  <=0.25 Sensitive     LEVOFLOXACIN <=0.12 Sensitive     NITROFURANTOIN <=16 Sensitive     TRIMETH/SULFA <=20 Sensitive     Labs: CBC: Recent Labs  Lab 04/26/24 0940 04/27/24 0617  WBC 4.1 5.8  NEUTROABS 1.8  --   HGB 6.8* 9.8*  HCT 25.9* 32.5*  MCV 64.1* 70.7*  PLT 385 303   Basic Metabolic Panel: Recent Labs  Lab 04/26/24 0940 04/27/24 0617  NA 138 137  K 3.8 3.8  CL 104 105  CO2 24 22  GLUCOSE 94 95  BUN 11 9  CREATININE 0.63 0.67  CALCIUM 8.8* 9.0   Liver Function Tests: Recent Labs  Lab 04/26/24 0940  AST 14*  ALT 9  ALKPHOS 54  BILITOT 0.5  PROT 7.5  ALBUMIN 4.1   CBG: No results for input(s): GLUCAP in the last 168 hours.  Discharge time spent: approximately 45 minutes spent on discharge counseling, evaluation of patient on day of discharge, and coordination of discharge planning with nursing, social work, pharmacy and case management  Signed: Lonni SHAUNNA Dalton, MD [facility] 04/27/2024         "

## 2024-05-05 ENCOUNTER — Telehealth: Payer: Self-pay

## 2024-05-05 NOTE — Telephone Encounter (Signed)
-----   Message from Rollo Bring, MD sent at 05/02/2024  9:17 AM EST ----- Regarding: RE: new gyn patient Offer her 1/23 @ 9:15 and see if the 9:15 OB can come at 8:15 instead. Thank you! ----- Message ----- From: Prentiss Isaiah SAILOR Sent: 05/02/2024   8:55 AM EST To: Rollo ONEIDA Bring, MD Subject: FW: new gyn patient                            Please advise on when to get her scheduled. ----- Message ----- From: Ozan, Jennifer, DO Sent: 04/27/2024   1:26 PM EST To: Rollo ONEIDA Bring, MD; [facility] Subject: new gyn patient                                Pt seen @ [facility] with symptomatic anemia due to heavy periods/fibroids  They started her on Megace , but definitely needs follow up and would prefer [facility].  Ideally, she is seen by Dr. Bring as she may be a future surgical candidate.  Thanks!

## 2024-05-05 NOTE — Telephone Encounter (Signed)
 Left voicemail for patient to call back and schedule her appointment on 1/23. Blocked slot for her at 9:15am.
